# Patient Record
Sex: Female | Born: 1985 | Hispanic: Yes | Marital: Married | Smoking: Never smoker
Health system: Southern US, Community
[De-identification: ages and names within clinical notes are randomized; demographics above are authoritative.]

## PROBLEM LIST (undated history)

## (undated) ENCOUNTER — Inpatient Hospital Stay (HOSPITAL_COMMUNITY): Payer: Self-pay

## (undated) ENCOUNTER — Ambulatory Visit (HOSPITAL_COMMUNITY): Admission: EM | Payer: Self-pay

## (undated) DIAGNOSIS — Z862 Personal history of diseases of the blood and blood-forming organs and certain disorders involving the immune mechanism: Secondary | ICD-10-CM

---

## 2007-09-17 ENCOUNTER — Inpatient Hospital Stay (HOSPITAL_COMMUNITY)
Admission: AD | Admit: 2007-09-17 | Discharge: 2007-09-20 | Payer: Self-pay | Source: Ambulatory Visit | Admitting: Obstetrics

## 2010-12-27 ENCOUNTER — Emergency Department (HOSPITAL_COMMUNITY): Payer: Self-pay

## 2010-12-27 ENCOUNTER — Emergency Department (HOSPITAL_COMMUNITY)
Admission: EM | Admit: 2010-12-27 | Discharge: 2010-12-27 | Disposition: A | Discharge status: Home / Self Care | Payer: Worker's Compensation | Attending: Emergency Medicine | Admitting: Emergency Medicine

## 2010-12-27 DIAGNOSIS — R229 Localized swelling, mass and lump, unspecified: Secondary | ICD-10-CM | POA: Insufficient documentation

## 2010-12-27 DIAGNOSIS — S060X1A Concussion with loss of consciousness of 30 minutes or less, initial encounter: Secondary | ICD-10-CM | POA: Insufficient documentation

## 2010-12-27 DIAGNOSIS — W319XXA Contact with unspecified machinery, initial encounter: Secondary | ICD-10-CM | POA: Insufficient documentation

## 2010-12-27 DIAGNOSIS — W208XXA Other cause of strike by thrown, projected or falling object, initial encounter: Secondary | ICD-10-CM | POA: Insufficient documentation

## 2010-12-27 DIAGNOSIS — R51 Headache: Secondary | ICD-10-CM | POA: Insufficient documentation

## 2010-12-27 DIAGNOSIS — S0003XA Contusion of scalp, initial encounter: Secondary | ICD-10-CM | POA: Insufficient documentation

## 2010-12-27 DIAGNOSIS — Y9289 Other specified places as the place of occurrence of the external cause: Secondary | ICD-10-CM | POA: Insufficient documentation

## 2011-02-20 NOTE — H&P (Signed)
NAMEMAHLI, Beth Russell                ACCOUNT NO.:  [id number]   MEDICAL RECORD NO.:  [id number]          PATIENT TYPE:  INP   LOCATION:  9167                          FACILITY:  [facility]   PHYSICIAN:  Roseanna Rainbow, M.D.DATE OF BIRTH:  April 27, 1986   DATE OF ADMISSION:  09/17/2007  DATE OF DISCHARGE:                              HISTORY & PHYSICAL   CHIEF COMPLAINT:  The patient is a 25 year old para 0 with an estimated  date of confinement of September 15, 2007 with an intrauterine pregnancy  at 40+ weeks complaining of contractions and rupture of membranes.   HISTORY OF PRESENT ILLNESS:  Please see the above.  The patient reports  clear fluid.   SOCIAL HISTORY:  She is single.  She denies any tobacco, ethanol or drug  use.   ALLERGIES:  NO KNOWN DRUG ALLERGIES.   PAST GYN HISTORY:  Noncontributory.   PAST MEDICAL HISTORY:  She denies.   PAST SURGICAL HISTORY:  She denies.   FAMILY HISTORY:  Noncontributory.   ANTEPARTUM COURSE:  Prenatal care with Dr. Gaynell Face, onset of care at 24  weeks.   PRENATAL LABS:  Hemoglobin 11, hematocrit 33, platelets 261,000.  Blood  type O+, antibody screen negative, RPR nonreactive, rubella immune,  hepatitis B surface antigen negative, HIV nonreactive, PPD negative, GC  probe negative.  Chlamydia probe negative.  One-hour GTT 128, GBS  negative.   PHYSICAL EXAM:  Vital signs stable, afebrile.  Fetal heart tracing  reassuring.  Tocodynamometer:  Irregular uterine contractions.  Sterile  vaginal exam per the RN, the cervix is 4 cm dilated, 80% effaced.   ASSESSMENT:  Primigravida at term, late latent versus early active  labor, fetal heart tracing consistent with fetal well-being.   PLAN:  Admission, expectant management.      Roseanna Rainbow, M.D.  Electronically Signed    LAJ/MEDQ  D:  09/17/2007  T:  09/18/2007  Job:  [id number]

## 2011-05-21 ENCOUNTER — Other Ambulatory Visit: Payer: Self-pay | Admitting: Family Medicine

## 2011-05-21 DIAGNOSIS — Z3689 Encounter for other specified antenatal screening: Secondary | ICD-10-CM

## 2011-05-29 ENCOUNTER — Encounter (HOSPITAL_COMMUNITY): Payer: Self-pay

## 2011-05-29 ENCOUNTER — Ambulatory Visit (HOSPITAL_COMMUNITY)
Admission: RE | Admit: 2011-05-29 | Discharge: 2011-05-29 | Disposition: A | Discharge status: Home / Self Care | Payer: Medicaid Other | Source: Ambulatory Visit | Attending: Family Medicine | Admitting: Family Medicine

## 2011-05-29 DIAGNOSIS — O358XX0 Maternal care for other (suspected) fetal abnormality and damage, not applicable or unspecified: Secondary | ICD-10-CM | POA: Insufficient documentation

## 2011-05-29 DIAGNOSIS — Z1389 Encounter for screening for other disorder: Secondary | ICD-10-CM | POA: Insufficient documentation

## 2011-05-29 DIAGNOSIS — Z363 Encounter for antenatal screening for malformations: Secondary | ICD-10-CM | POA: Insufficient documentation

## 2011-05-29 DIAGNOSIS — Z3689 Encounter for other specified antenatal screening: Secondary | ICD-10-CM

## 2011-07-16 LAB — CBC
HCT: 34.7 — ABNORMAL LOW
Hemoglobin: 10.4 — ABNORMAL LOW
MCHC: 34.4
MCHC: 35.4
MCV: 97
Platelets: 194
Platelets: 204
RDW: 12.9
RDW: 13.1

## 2011-07-16 LAB — RPR: RPR Ser Ql: NONREACTIVE

## 2011-08-13 ENCOUNTER — Other Ambulatory Visit (HOSPITAL_COMMUNITY): Payer: Self-pay | Admitting: Physician Assistant

## 2011-08-21 ENCOUNTER — Ambulatory Visit (HOSPITAL_COMMUNITY)
Admission: RE | Admit: 2011-08-21 | Discharge: 2011-08-21 | Disposition: A | Discharge status: Home / Self Care | Payer: Medicaid Other | Source: Ambulatory Visit | Attending: Physician Assistant | Admitting: Physician Assistant

## 2011-08-21 DIAGNOSIS — O365990 Maternal care for other known or suspected poor fetal growth, unspecified trimester, not applicable or unspecified: Secondary | ICD-10-CM | POA: Insufficient documentation

## 2011-08-21 DIAGNOSIS — Z3689 Encounter for other specified antenatal screening: Secondary | ICD-10-CM | POA: Insufficient documentation

## 2011-08-24 ENCOUNTER — Other Ambulatory Visit (HOSPITAL_COMMUNITY): Payer: Self-pay | Admitting: Physician Assistant

## 2011-08-24 DIAGNOSIS — O4100X0 Oligohydramnios, unspecified trimester, not applicable or unspecified: Secondary | ICD-10-CM

## 2011-08-25 ENCOUNTER — Encounter (HOSPITAL_COMMUNITY): Payer: Self-pay | Admitting: *Deleted

## 2011-08-25 ENCOUNTER — Inpatient Hospital Stay (HOSPITAL_COMMUNITY)
Admission: AD | Admit: 2011-08-25 | Discharge: 2011-08-25 | Disposition: A | Discharge status: Home / Self Care | Payer: Self-pay | Source: Ambulatory Visit | Attending: Obstetrics & Gynecology | Admitting: Obstetrics & Gynecology

## 2011-08-25 DIAGNOSIS — Z862 Personal history of diseases of the blood and blood-forming organs and certain disorders involving the immune mechanism: Secondary | ICD-10-CM

## 2011-08-25 DIAGNOSIS — N760 Acute vaginitis: Secondary | ICD-10-CM | POA: Insufficient documentation

## 2011-08-25 DIAGNOSIS — Z603 Acculturation difficulty: Secondary | ICD-10-CM

## 2011-08-25 DIAGNOSIS — Z3490 Encounter for supervision of normal pregnancy, unspecified, unspecified trimester: Secondary | ICD-10-CM

## 2011-08-25 DIAGNOSIS — O2390 Unspecified genitourinary tract infection in pregnancy, unspecified trimester: Secondary | ICD-10-CM | POA: Insufficient documentation

## 2011-08-25 DIAGNOSIS — A499 Bacterial infection, unspecified: Secondary | ICD-10-CM | POA: Insufficient documentation

## 2011-08-25 DIAGNOSIS — B9689 Other specified bacterial agents as the cause of diseases classified elsewhere: Secondary | ICD-10-CM | POA: Insufficient documentation

## 2011-08-25 HISTORY — DX: Personal history of diseases of the blood and blood-forming organs and certain disorders involving the immune mechanism: Z86.2

## 2011-08-25 LAB — URINALYSIS, ROUTINE W REFLEX MICROSCOPIC
Hgb urine dipstick: NEGATIVE
Nitrite: NEGATIVE
Protein, ur: NEGATIVE mg/dL
Specific Gravity, Urine: 1.015 (ref 1.005–1.030)
Urobilinogen, UA: 0.2 mg/dL (ref 0.0–1.0)

## 2011-08-25 LAB — URINE MICROSCOPIC-ADD ON

## 2011-08-25 LAB — WET PREP, GENITAL
Trich, Wet Prep: NONE SEEN
Yeast Wet Prep HPF POC: NONE SEEN

## 2011-08-25 MED ORDER — METRONIDAZOLE 500 MG PO TABS: 500.0000 mg | ORAL_TABLET | Freq: Two times a day (BID) | ORAL | Status: AC

## 2011-08-25 NOTE — Progress Notes (Signed)
Pt reports having some leaking of clear fluid x 3 days. Enough to wet her underwear not enough to wear a pad. Denies pain and reports good fetal movement.

## 2011-08-25 NOTE — ED Provider Notes (Signed)
History   Chief Complaint: Vaginal Discharge  HPI Beth Russell 25 y.o. female  G2P1001 at [redacted]w[redacted]d  presenting with vaginal discharge.   The patient reports she has had discharge since Wednesday. She describes a fishy odor. Whitish color, liquidy.  Patient denies contractions/decreased fetal movement/blood from vagina/rush of fluid.     OB History    Grav Para Term Preterm Abortions TAB SAB Ect Mult Living   2 1 1  0 0 0 0 0 0 1    no complications in first pregnancy.   Past Medical History  Diagnosis Date  . No pertinent past medical history   . History of sickle cell trait 08/25/2011  . Language barrier-spanish speaking only  08/25/2011    Past Surgical History  Procedure Date  . No past surgeries     No family history on file.  History  Substance Use Topics  . Smoking status: Never Smoker   . Smokeless tobacco: Not on file  . Alcohol Use: No    Allergies: No Known Allergies  Prescriptions prior to admission  Medication Sig Dispense Refill  . loratadine (CLARITIN) 10 MG tablet Take 10 mg by mouth daily.        . prenatal vitamin w/FE, FA (PRENATAL 1 + 1) 27-1 MG TABS Take 1 tablet by mouth daily.          ROS negative except as noted in HPI   Physical Exam   Blood pressure 119/64, pulse 110, temperature 98.3 F (36.8 C), temperature source Oral, resp. rate 18, height 5\' 1"  (1.549 m), weight 64.774 kg (142 lb 12.8 oz), last menstrual period 12/27/2010.  Physical Exam  Constitutional: She is oriented to person, place, and time. She appears well-developed and well-nourished. No distress.  Cardiovascular: Normal rate and regular rhythm.  Exam reveals no gallop and no friction rub.   No murmur heard. Respiratory: Breath sounds normal. She has no wheezes. She has no rales. She exhibits no tenderness.  GI:       Gravid. Fundal height c/w dates.   Musculoskeletal: She exhibits no edema and no tenderness.       Prominent varicose veins noted on bilateral ankles.    Neurological: She is alert and oriented to person, place, and time.   Speculum exam-cystocele noted on vaginal exam. revealed thick whitish fluid/discharge. Normal closed cervix.  MAU Course  Procedures  MDM Obtain wet prep and gc/chlamydia due to discharge. History not consistent with rupture-will not obtain ferning.  Moderate leuks on wet prep. Fishy odor to discharge. Will treat presumptively as BV.   Assessment and Plan  #1 Beth Russell 25 y.o. female  G2P1001 at [redacted]w[redacted]d #2 Language Barrier-spanish speaking only #3 Bacterial vaginosis #4 history of sickle trait  Will treat with 7 days of metronidazole 500mg  Discharge home.  Given instructions for false labor, BV, and kick counts.   Case Discussed with Beth Russell, CNM  Beth Conch, MD PGY1 08/25/2011, 6:26 PM

## 2011-08-25 NOTE — Progress Notes (Signed)
Pt presents to MAU with complaints of vaginal discharge that started 3 days ago. Pt is [redacted]w[redacted]d

## 2011-08-27 LAB — GC/CHLAMYDIA PROBE AMP, GENITAL
Chlamydia, DNA Probe: NEGATIVE
GC Probe Amp, Genital: NEGATIVE

## 2011-08-28 ENCOUNTER — Ambulatory Visit (HOSPITAL_COMMUNITY)
Admission: RE | Admit: 2011-08-28 | Discharge: 2011-08-28 | Disposition: A | Discharge status: Home / Self Care | Payer: Self-pay | Source: Ambulatory Visit | Attending: Physician Assistant | Admitting: Physician Assistant

## 2011-08-28 DIAGNOSIS — Z3689 Encounter for other specified antenatal screening: Secondary | ICD-10-CM | POA: Insufficient documentation

## 2011-08-28 DIAGNOSIS — O4100X0 Oligohydramnios, unspecified trimester, not applicable or unspecified: Secondary | ICD-10-CM

## 2011-10-09 NOTE — L&D Delivery Note (Signed)
Delivery Note At 4:33 AM a viable female was delivered via Vaginal, Spontaneous Delivery (Presentation: Left Occiput Anterior).  APGAR: 9, 9; weight 6 lb 0.1 oz (2725 g).   Placenta status:spont, intact 3VC ,  with the following complications: .none. Unsure when SROM occurred. Not felt on my initial exam.  Anesthesia: Epidural  Episiotomy: none Lacerations: none  Est. Blood Loss (mL): 300  Mom to postpartum.  Baby to nursery-stable.  POE,DEIRDRE 10/10/2011, 4:42 AM

## 2011-10-10 ENCOUNTER — Encounter (HOSPITAL_COMMUNITY): Payer: Self-pay | Admitting: *Deleted

## 2011-10-10 ENCOUNTER — Inpatient Hospital Stay (HOSPITAL_COMMUNITY)
Admission: AD | Admit: 2011-10-10 | Discharge: 2011-10-12 | DRG: 775 | Disposition: A | Discharge status: Home / Self Care | Payer: Medicaid Other | Source: Ambulatory Visit | Attending: Obstetrics & Gynecology | Admitting: Obstetrics & Gynecology

## 2011-10-10 ENCOUNTER — Encounter (HOSPITAL_COMMUNITY): Payer: Self-pay | Admitting: Anesthesiology

## 2011-10-10 ENCOUNTER — Inpatient Hospital Stay (HOSPITAL_COMMUNITY): Payer: Medicaid Other | Admitting: Anesthesiology

## 2011-10-10 LAB — GC/CHLAMYDIA PROBE AMP, GENITAL: Gonorrhea: NEGATIVE

## 2011-10-10 LAB — CBC
Hemoglobin: 11.3 g/dL — ABNORMAL LOW (ref 12.0–15.0)
MCH: 32.5 pg (ref 26.0–34.0)
MCHC: 34.8 g/dL (ref 30.0–36.0)
RDW: 13.1 % (ref 11.5–15.5)

## 2011-10-10 LAB — HIV ANTIBODY (ROUTINE TESTING W REFLEX): HIV: NONREACTIVE

## 2011-10-10 LAB — RPR: RPR Ser Ql: NONREACTIVE

## 2011-10-10 MED ORDER — SODIUM BICARBONATE 8.4 % IV SOLN
INTRAVENOUS | Status: DC | PRN
  Administered 2011-10-10: 4 mL via EPIDURAL

## 2011-10-10 MED ORDER — WITCH HAZEL-GLYCERIN EX PADS: 1.0000 "application " | MEDICATED_PAD | CUTANEOUS | Status: DC | PRN

## 2011-10-10 MED ORDER — CITRIC ACID-SODIUM CITRATE 334-500 MG/5ML PO SOLN: 30.0000 mL | ORAL | Status: DC | PRN

## 2011-10-10 MED ORDER — ONDANSETRON HCL 4 MG/2ML IJ SOLN: 4.0000 mg | Freq: Four times a day (QID) | INTRAMUSCULAR | Status: DC | PRN

## 2011-10-10 MED ORDER — PRENATAL MULTIVITAMIN CH
1.0000 | ORAL_TABLET | Freq: Every day | ORAL | Status: DC
  Administered 2011-10-10 – 2011-10-12 (×3): 1 via ORAL
  Filled 2011-10-10 (×3): qty 1

## 2011-10-10 MED ORDER — DIBUCAINE 1 % RE OINT: 1.0000 "application " | TOPICAL_OINTMENT | RECTAL | Status: DC | PRN

## 2011-10-10 MED ORDER — ONDANSETRON HCL 4 MG/2ML IJ SOLN: 4.0000 mg | INTRAMUSCULAR | Status: DC | PRN

## 2011-10-10 MED ORDER — SALINE SPRAY 0.65 % NA SOLN
1.0000 | NASAL | Status: DC | PRN
  Administered 2011-10-11 – 2011-10-12 (×3): 1 via NASAL
  Filled 2011-10-10: qty 44

## 2011-10-10 MED ORDER — ACETAMINOPHEN 325 MG PO TABS: 650.0000 mg | ORAL_TABLET | ORAL | Status: DC | PRN

## 2011-10-10 MED ORDER — IBUPROFEN 600 MG PO TABS
600.0000 mg | ORAL_TABLET | Freq: Four times a day (QID) | ORAL | Status: DC
  Administered 2011-10-10 – 2011-10-12 (×9): 600 mg via ORAL
  Filled 2011-10-10: qty 11
  Filled 2011-10-10 (×8): qty 1

## 2011-10-10 MED ORDER — DIPHENHYDRAMINE HCL 25 MG PO CAPS: 25.0000 mg | ORAL_CAPSULE | Freq: Four times a day (QID) | ORAL | Status: DC | PRN

## 2011-10-10 MED ORDER — SIMETHICONE 80 MG PO CHEW: 80.0000 mg | CHEWABLE_TABLET | ORAL | Status: DC | PRN

## 2011-10-10 MED ORDER — IBUPROFEN 600 MG PO TABS: 600.0000 mg | ORAL_TABLET | Freq: Four times a day (QID) | ORAL | Status: DC | PRN

## 2011-10-10 MED ORDER — EPHEDRINE 5 MG/ML INJ
10.0000 mg | INTRAVENOUS | Status: DC | PRN
  Filled 2011-10-10 (×2): qty 4

## 2011-10-10 MED ORDER — OXYTOCIN 20 UNITS IN LACTATED RINGERS INFUSION - SIMPLE
125.0000 mL/h | Freq: Once | INTRAVENOUS | Status: AC
  Administered 2011-10-10: 125 mL/h via INTRAVENOUS

## 2011-10-10 MED ORDER — OXYTOCIN BOLUS FROM INFUSION
500.0000 mL | Freq: Once | INTRAVENOUS | Status: DC
  Filled 2011-10-10: qty 1000
  Filled 2011-10-10: qty 500

## 2011-10-10 MED ORDER — PHENYLEPHRINE 40 MCG/ML (10ML) SYRINGE FOR IV PUSH (FOR BLOOD PRESSURE SUPPORT)
80.0000 ug | PREFILLED_SYRINGE | INTRAVENOUS | Status: DC | PRN
  Filled 2011-10-10 (×2): qty 5

## 2011-10-10 MED ORDER — ZOLPIDEM TARTRATE 5 MG PO TABS: 5.0000 mg | ORAL_TABLET | Freq: Every evening | ORAL | Status: DC | PRN

## 2011-10-10 MED ORDER — EPHEDRINE 5 MG/ML INJ
10.0000 mg | INTRAVENOUS | Status: DC | PRN
  Filled 2011-10-10: qty 4

## 2011-10-10 MED ORDER — DIPHENHYDRAMINE HCL 50 MG/ML IJ SOLN: 12.5000 mg | INTRAMUSCULAR | Status: DC | PRN

## 2011-10-10 MED ORDER — LACTATED RINGERS IV SOLN: 500.0000 mL | INTRAVENOUS | Status: DC | PRN

## 2011-10-10 MED ORDER — BENZOCAINE-MENTHOL 20-0.5 % EX AERO: 1.0000 "application " | INHALATION_SPRAY | CUTANEOUS | Status: DC | PRN

## 2011-10-10 MED ORDER — LACTATED RINGERS IV SOLN
INTRAVENOUS | Status: DC
  Administered 2011-10-10: 03:00:00 via INTRAVENOUS

## 2011-10-10 MED ORDER — BENZOCAINE-MENTHOL 20-0.5 % EX AERO
INHALATION_SPRAY | CUTANEOUS | Status: AC
  Administered 2011-10-10: 07:00:00
  Filled 2011-10-10: qty 56

## 2011-10-10 MED ORDER — LACTATED RINGERS IV SOLN: 500.0000 mL | Freq: Once | INTRAVENOUS | Status: DC

## 2011-10-10 MED ORDER — PHENYLEPHRINE 40 MCG/ML (10ML) SYRINGE FOR IV PUSH (FOR BLOOD PRESSURE SUPPORT)
80.0000 ug | PREFILLED_SYRINGE | INTRAVENOUS | Status: DC | PRN
  Filled 2011-10-10: qty 5

## 2011-10-10 MED ORDER — ONDANSETRON HCL 4 MG PO TABS: 4.0000 mg | ORAL_TABLET | ORAL | Status: DC | PRN

## 2011-10-10 MED ORDER — TETANUS-DIPHTH-ACELL PERTUSSIS 5-2.5-18.5 LF-MCG/0.5 IM SUSP
0.5000 mL | Freq: Once | INTRAMUSCULAR | Status: AC
  Administered 2011-10-11: 0.5 mL via INTRAMUSCULAR

## 2011-10-10 MED ORDER — FENTANYL 2.5 MCG/ML BUPIVACAINE 1/10 % EPIDURAL INFUSION (WH - ANES)
14.0000 mL/h | INTRAMUSCULAR | Status: DC
  Filled 2011-10-10: qty 60

## 2011-10-10 MED ORDER — LIDOCAINE HCL (PF) 1 % IJ SOLN
30.0000 mL | INTRAMUSCULAR | Status: DC | PRN
  Filled 2011-10-10 (×2): qty 30

## 2011-10-10 MED ORDER — PSEUDOEPHEDRINE HCL 30 MG PO TABS
60.0000 mg | ORAL_TABLET | Freq: Four times a day (QID) | ORAL | Status: DC | PRN
  Administered 2011-10-11 – 2011-10-12 (×3): 60 mg via ORAL
  Filled 2011-10-10 (×2): qty 1
  Filled 2011-10-10 (×2): qty 2

## 2011-10-10 MED ORDER — FENTANYL 2.5 MCG/ML BUPIVACAINE 1/10 % EPIDURAL INFUSION (WH - ANES)
INTRAMUSCULAR | Status: DC | PRN
  Administered 2011-10-10: 12 mL/h via EPIDURAL

## 2011-10-10 MED ORDER — LANOLIN HYDROUS EX OINT: TOPICAL_OINTMENT | CUTANEOUS | Status: DC | PRN

## 2011-10-10 MED ORDER — OXYCODONE-ACETAMINOPHEN 5-325 MG PO TABS: 2.0000 | ORAL_TABLET | ORAL | Status: DC | PRN

## 2011-10-10 MED ORDER — SENNOSIDES-DOCUSATE SODIUM 8.6-50 MG PO TABS
2.0000 | ORAL_TABLET | Freq: Every day | ORAL | Status: DC
  Administered 2011-10-10 – 2011-10-11 (×2): 2 via ORAL

## 2011-10-10 MED ORDER — OXYCODONE-ACETAMINOPHEN 5-325 MG PO TABS
1.0000 | ORAL_TABLET | ORAL | Status: DC | PRN
  Administered 2011-10-10 – 2011-10-11 (×4): 1 via ORAL
  Filled 2011-10-10 (×4): qty 1

## 2011-10-10 MED ORDER — FLEET ENEMA 7-19 GM/118ML RE ENEM: 1.0000 | ENEMA | RECTAL | Status: DC | PRN

## 2011-10-10 NOTE — Progress Notes (Signed)
In safety rounds.  Will call back for full report.

## 2011-10-10 NOTE — Anesthesia Procedure Notes (Signed)

## 2011-10-10 NOTE — H&P (Signed)
Beth Russell is a 26 y.o. female presenting for term labor at [redacted]w[redacted]d by [place] at 19w Onset UCs 12 MN. Positive show . No leakage of fluid.  Maternal Medical History:  Reason for admission: Reason for admission: contractions.  Reason for Admission:   nauseaContractions: Onset was 3-5 hours ago.   Frequency: regular.    Fetal activity: Perceived fetal activity is normal.   Last perceived fetal movement was within the past hour.    Prenatal complications: no prenatal complications Prenatal Complications - Diabetes: none.    OB History    Grav Para Term Preterm Abortions TAB SAB Ect Mult Living   2 1 1  0 0 0 0 0 0 1     Past Medical History  Diagnosis Date  . No pertinent past medical history   . History of sickle cell trait 08/25/2011  . Language barrier-spanish speaking only  08/25/2011  2008 NSVD without complications, different paternity  Past Surgical History  Procedure Date  . No past surgeries    Family History: family history is not on file. Social History:  reports that she has never smoked. She does not have any smokeless tobacco history on file. She reports that she does not drink alcohol or use illicit drugs.  Review of Systems  Constitutional: Negative for fever.  Respiratory: Negative for cough.   Gastrointestinal: Positive for abdominal pain. Negative for nausea and vomiting.  Genitourinary: Negative for dysuria.  Skin: Negative for rash.  Neurological: Negative for headaches.    Dilation: 5.5 Effacement (%): 90 Station: -2 Exam by:: Felipa Furnace RN Blood pressure 126/75, pulse 105, temperature 98.6 F (37 C), temperature source Oral, resp. rate 20, height 5\' 1"  (1.549 m), weight 72.576 kg (160 lb), last menstrual period 12/27/2010. Maternal Exam:  Uterine Assessment: Contraction strength is firm.  Contraction frequency is regular.   Abdomen: Estimated fetal weight is 6 1/2#.   Fetal presentation: vertex  Introitus: Normal vulva. Vagina is negative for  discharge.  Ferning test: not done.   Pelvis: adequate for delivery.   Cervix: Cervix evaluated by digital exam.     Fetal Exam Fetal Monitor Review: Mode: ultrasound.   Baseline rate: 140-145.  Variability: moderate (6-25 bpm).   Pattern: accelerations present and no decelerations.   Prolonged decel 90's x4-5 min shortly after epidural  Fetal State Assessment: Category I - tracings are normal.     Physical Exam  Constitutional: She is oriented to person, place, and time. She appears well-developed and well-nourished. She appears distressed.  HENT:  Head: Normocephalic.  Neck: Neck supple.  Cardiovascular: Normal rate, regular rhythm and normal heart sounds.   Respiratory: Effort normal and breath sounds normal.  GI: Soft. There is no tenderness.  Genitourinary: Uterus normal. No vaginal discharge found.       VE per RN 5-6/80/-2  Musculoskeletal: Normal range of motion.  Neurological: She is alert and oriented to person, place, and time. She has normal reflexes.  Skin: Skin is warm and dry.  Psychiatric: She has a normal mood and affect.     Prenatal labs: ABO, Rh: O/Positive/-- (01/02 0303) Antibody: Negative (01/02 0303) Rubella: Immune (01/02 0303) RPR:   NR HBsAg: Negative (01/02 0303)  HIV: Non-reactive (01/02 0303)  GBS: Negative (01/02 0303)  1 hr glucola 95  Assessment/Plan: G2P1001 at [redacted]w[redacted]d in active labor Expectant management   Beth Russell 10/10/2011, 3:48 AM

## 2011-10-10 NOTE — Progress Notes (Addendum)
Beth Russell is a 26 y.o. G2P1001 at [redacted]w[redacted]d, admitted for labor  Subjective: Comfortable with epidural  Objective: BP 122/73  Pulse 92  Temp(Src) 98.6 F (37 C) (Oral)  Resp 20  Ht 5\' 1"  (1.549 m)  Wt 72.576 kg (160 lb)  BMI 30.23 kg/m2  LMP 12/27/2010  Fetal Heart Rate: 145 Variability: moderate Accelerations: present Decelerations: absent  Contractions: q 2-4 min x 50-60 sec  SVE:   Dilation: 7 Effacement (%): 90 Station: -1 Exam by:: Beth Russell, CNM  Cx 7-8, bloody show   Assessment / Plan: 26 y.o. G2P1001 at [redacted]w[redacted]d   Labor: active phase progressing Fetal Wellbeing: category 1 Pain Control:  epidural   Beth Russell 10/10/2011, 4:06 AM

## 2011-10-10 NOTE — Anesthesia Preprocedure Evaluation (Signed)

## 2011-10-10 NOTE — Anesthesia Postprocedure Evaluation (Signed)
Anesthesia Post Note  Patient: Beth Russell  Procedure(s) Performed: * No procedures listed *  Anesthesia type: Epidural  Patient location: Mother/Baby  Post pain: Pain level controlled  Post assessment: Post-op Vital signs reviewed  Last Vitals:  Filed Vitals:   10/10/11 0615  BP: 115/67  Pulse: 90  Temp: 36.5 C  Resp: 18    Post vital signs: Reviewed  Level of consciousness: awake  Complications: No apparent anesthesia complications

## 2011-10-10 NOTE — Progress Notes (Signed)
Pt G2 P1 at 37.3wks, having contractions with pinkish blood.

## 2011-10-10 NOTE — Progress Notes (Signed)
Pt delivered viable female with APGARS 9, 9 SVD. Poe CNM present.

## 2011-10-10 NOTE — Progress Notes (Signed)
Notified of VE and ctx pattern.  Notified of pt request for epidural. Will place admit orders.

## 2011-10-11 NOTE — Progress Notes (Signed)
Post Partum Day 1 Subjective: no complaints, up ad lib, voiding, tolerating PO, + flatus and breast feeding, bottle supplement  Objective: Blood pressure 111/69, pulse 116, temperature 98.3 F (36.8 C), temperature source Oral, resp. rate 20, height 5\' 1"  (1.549 m), weight 72.576 kg (160 lb), last menstrual period 12/27/2010, unknown if currently breastfeeding.  Physical Exam:  General: alert, cooperative and no distress Lochia: appropriate Uterine Fundus: firm Incision: n/a DVT Evaluation: No evidence of DVT seen on physical exam.   Basename 10/10/11 0244  HGB 11.3*  HCT 32.5*    Assessment/Plan:  Postpartum day 1 s.p SVD Plan for discharge tomorrow DESIRES MIRENA FOR CONTRACEPTION   LOS: 1 day   Forest Redwine V 10/11/2011, 3:51 PM

## 2011-10-12 MED ORDER — IBUPROFEN 600 MG PO TABS: 600.0000 mg | ORAL_TABLET | Freq: Four times a day (QID) | ORAL | Status: AC

## 2011-10-12 NOTE — Progress Notes (Signed)
Post Partum Day 2 Subjective: no complaints, up ad lib, voiding, tolerating PO and + flatus  Objective: Blood pressure 107/69, pulse 90, temperature 97.5 F (36.4 C), temperature source Oral, resp. rate 20, height 5\' 1"  (1.549 m), weight 72.576 kg (160 lb), last menstrual period 12/27/2010, unknown if currently breastfeeding.  Physical Exam:  General: alert, cooperative and no distress Lochia: appropriate Uterine Fundus: firm Incision: N/A DVT Evaluation: Homan's sign negative   Basename 10/10/11 0244  HGB 11.3*  HCT 32.5*    Assessment/Plan: Discharge home, Breastfeeding and Contraception IUD (mirena) Declines circumcision.   LOS: 2 days    D. Piloto [employer]. MD PGY-1  10/12/2011, 8:53 AM

## 2011-10-15 NOTE — Discharge Summary (Signed)
Obstetric Discharge Summary Reason for Admission: onset of labor Intrapartum Procedures: spontaneous vaginal delivery Postpartum Procedures: none Complications-Operative and Postpartum: none Hemoglobin  Date Value Range Status  10/10/2011 11.3* 12.0-15.0 (g/dL) Final     HCT  Date Value Range Status  10/10/2011 32.5* 36.0-46.0 (%) Final    Discharge Diagnoses: Term Pregnancy-delivered  Discharge Information: Date: 10/15/2011 Activity: unrestricted Diet: routine Medications: Ibuprofen Condition: stable Instructions: refer to practice specific booklet Discharge to: home Breastfeeding. Contraception: IUD( Mirena) No interested in circumcision for her baby boy.  Follow-up Information    Make an appointment in 6 weeks to follow up. Maricela Curet una cita en 6 semanas con su medico.)          Newborn Data: Live born female  Birth Weight: 6 lb 0.1 oz (2725 g) APGAR: 9, 9  Home with mother.  PILOTO, Yaslyn Cumby 10/15/2011, 3:09 PM

## 2011-10-16 NOTE — Discharge Summary (Signed)
Agree with note. 

## 2013-05-09 ENCOUNTER — Emergency Department (HOSPITAL_COMMUNITY)
Admission: EM | Admit: 2013-05-09 | Discharge: 2013-05-09 | Disposition: A | Discharge status: Home / Self Care | Payer: Self-pay | Attending: Emergency Medicine | Admitting: Emergency Medicine

## 2013-05-09 ENCOUNTER — Emergency Department (HOSPITAL_COMMUNITY): Payer: Self-pay

## 2013-05-09 ENCOUNTER — Encounter (HOSPITAL_COMMUNITY): Payer: Self-pay | Admitting: Physical Medicine and Rehabilitation

## 2013-05-09 DIAGNOSIS — H9209 Otalgia, unspecified ear: Secondary | ICD-10-CM | POA: Insufficient documentation

## 2013-05-09 DIAGNOSIS — Z862 Personal history of diseases of the blood and blood-forming organs and certain disorders involving the immune mechanism: Secondary | ICD-10-CM | POA: Insufficient documentation

## 2013-05-09 DIAGNOSIS — M542 Cervicalgia: Secondary | ICD-10-CM | POA: Insufficient documentation

## 2013-05-09 MED ORDER — LIDOCAINE VISCOUS 2 % MT SOLN
15.0000 mL | Freq: Once | OROMUCOSAL | Status: AC
  Administered 2013-05-09: 15 mL via OROMUCOSAL
  Filled 2013-05-09: qty 15

## 2013-05-09 NOTE — ED Provider Notes (Signed)
  Medical screening examination/treatment/procedure(s) were performed by non-physician practitioner and as supervising physician I was immediately available for consultation/collaboration.    Espn Zeman, MD 05/09/13 2355 

## 2013-05-09 NOTE — ED Provider Notes (Signed)
CSN: [id number]     Arrival date & time 05/09/13  1548 History     First MD Initiated Contact with Patient 05/09/13 1612     Chief Complaint  Patient presents with  . Otalgia  . Sore Throat   (Consider location/radiation/quality/duration/timing/severity/associated sxs/prior Treatment) HPI Comments: Patient states that for four years she has had a burning pain in the right side of her throat.  The pain is persistent and radiates into her right ear, and right upper back.  States when she swallows she feels there is something in the right side of her throat, described as phlegm.   States she had blood come from her right ear yesterday.  Also notes burning pain of her right nostril that when she inhales, the pain radiates over her right face into her right back.  She also occasionally gets bumps on the right side of her tongue.  The pain in her throat is exacerbated by cigarette smoke.  Denies fever, hearing problems, cough, postnasal drip, SOB, CP, abdominal pain.  No difficulty swallowing or breathing.  She is eating and drinking normally.   She has been to many doctors for this and is always prescribed antibiotics which do not help.  States she would like some radiographs to see what this is.    The history is provided by the patient.    Past Medical History  Diagnosis Date  . No pertinent past medical history   . History of sickle cell trait 08/25/2011  . Language barrier-spanish speaking only  08/25/2011   Past Surgical History  Procedure Laterality Date  . No past surgeries     No family history on file. History  Substance Use Topics  . Smoking status: Never Smoker   . Smokeless tobacco: Not on file  . Alcohol Use: No   OB History   Grav Para Term Preterm Abortions TAB SAB Ect Mult Living   2 2 2  0 0 0 0 0 0 2     Review of Systems  Constitutional: Negative for fever and chills.  HENT: Positive for ear pain and sore throat. Negative for hearing loss, congestion, trouble  swallowing, voice change, postnasal drip and sinus pressure.   Respiratory: Negative for shortness of breath, wheezing and stridor.   Cardiovascular: Negative for chest pain.  Gastrointestinal: Negative for abdominal pain.    Allergies  Review of patient's allergies indicates no known allergies.  Home Medications  No current outpatient prescriptions on file. BP 94/64  Pulse 75  Temp(Src) 97.9 F (36.6 C) (Oral)  Resp 18  SpO2 100% Physical Exam  Nursing note and vitals reviewed. Constitutional: She appears well-developed and well-nourished. No distress.  HENT:  Head: Normocephalic and atraumatic.  Right Ear: Tympanic membrane and ear canal normal.  Left Ear: Tympanic membrane and ear canal normal.  Nose: Nose normal. Right sinus exhibits no maxillary sinus tenderness and no frontal sinus tenderness. Left sinus exhibits no maxillary sinus tenderness and no frontal sinus tenderness.  Mouth/Throat: Uvula is midline and oropharynx is clear and moist. Mucous membranes are not pale and not dry. No edematous. No oropharyngeal exudate, posterior oropharyngeal edema or posterior oropharyngeal erythema.  Small amount of reddish brown cerumen in right ear canal.   Eyes: Conjunctivae are normal.  Neck: Trachea normal, normal range of motion and phonation normal. Neck supple. No tracheal tenderness, no spinous process tenderness and no muscular tenderness present. No rigidity. No tracheal deviation, no edema, no erythema and normal range of motion present. No  thyromegaly present.  No palpable masses of the neck  Cardiovascular: Normal rate and regular rhythm.   Pulmonary/Chest: Effort normal and breath sounds normal. No stridor. No respiratory distress. She has no wheezes. She has no rales.  Lymphadenopathy:    She has no cervical adenopathy.  Neurological: She is alert.  Skin: She is not diaphoretic.  Psychiatric: She has a normal mood and affect. Her behavior is normal.    ED Course    Procedures (including critical care time)  Labs Reviewed - No data to display Dg Neck Soft Tissue  05/09/2013   *RADIOLOGY REPORT*  Clinical Data: 27 year old female with throat pain and sore throat.  NECK SOFT TISSUES - 1+ VIEW  Comparison: None  Findings: The prevertebral soft tissues are within normal limits. The epiglottis and aryepiglottic folds are within normal limits. There is no evidence of tracheal narrowing. No radio-opaque foreign bodies are present. The visualized bony structures are unremarkable.  IMPRESSION: Unremarkable exam.   Original Report Authenticated By: Harmon Pier, M.D.   1. Neck pain on right side     MDM  Pt with 4 years of FB sensation vs burning pain in her right neck/right throat.  Radiation throughout right head and neck.  Pt is afebrile and nontoxic.  No palpable masses.  TMs intact and ear canals normal - red/brown cerumen in right ear may have been what patient saw as "blood."  Exam otherwise unremarkable. GI cocktail helped the pain in her throat but not the pain in the right neck and ear.  Pt is currently breastfeeding and I am unable to give her IV contrast for a CT scan.  My suspicion is also low for anything emergent but pt does not currently have a PCP or good follow up.  I therefore ordered an xray, soft tissue neck, which was negative.  Pt d/c home with ENT follow up.  PCP resources given. She declined pain medication.  Discussed all results,  findings, treatment, follow up  with patient.  Pt given return precautions.  Pt verbalizes understanding and agrees with plan.       Trixie Dredge, PA-C 05/09/13 2127

## 2013-05-09 NOTE — ED Notes (Signed)
Pt presents to department for evaluation of R sided ear pain and sore throat. Pt states sore throat x3 years. 5/10 pain at the time. Denies fever. Pt/family does not speak english, translator phone used. Pt is alert and oriented x4. NAD.

## 2013-10-08 NOTE — L&D Delivery Note (Signed)
Patient is 28 y.o. [id number] 6029w5d by early sono admitted in active labor, hx of precipitous delivery   Delivery Note At 2:05 PM a viable female was delivered via Vaginal, Spontaneous Delivery (Presentation: Right Occiput Anterior).  APGAR: 8, 9; weight  .   Placenta status: Intact, Spontaneous.  Cord: 3 vessels with the following complications: None.  Delivery of shoulders delayed 2/2 maternal effort, posterior shoulder delivered first; not true dystocia.  Anesthesia: None  Episiotomy: None Lacerations: 1st degree Suture Repair: 3.0 vicryl rapide Est. Blood Loss (mL): 200  Mom to postpartum.  Baby to Couplet care / Skin to Skin.  Marsena Taff ROCIO 10/06/2014, 2:34 PM

## 2013-12-08 ENCOUNTER — Ambulatory Visit: Payer: No Typology Code available for payment source | Attending: Internal Medicine

## 2014-02-03 ENCOUNTER — Encounter (HOSPITAL_COMMUNITY): Payer: Self-pay | Admitting: Emergency Medicine

## 2014-02-03 ENCOUNTER — Emergency Department (HOSPITAL_COMMUNITY)
Admission: EM | Admit: 2014-02-03 | Discharge: 2014-02-03 | Disposition: A | Discharge status: Home / Self Care | Payer: No Typology Code available for payment source | Source: Home / Self Care | Attending: Family Medicine | Admitting: Family Medicine

## 2014-02-03 DIAGNOSIS — K0889 Other specified disorders of teeth and supporting structures: Secondary | ICD-10-CM

## 2014-02-03 DIAGNOSIS — K089 Disorder of teeth and supporting structures, unspecified: Secondary | ICD-10-CM

## 2014-02-03 MED ORDER — AMOXICILLIN 500 MG PO CAPS: 500.0000 mg | ORAL_CAPSULE | Freq: Three times a day (TID) | ORAL | Status: DC

## 2014-02-03 MED ORDER — DICLOFENAC SODIUM 50 MG PO TBEC: 50.0000 mg | DELAYED_RELEASE_TABLET | Freq: Two times a day (BID) | ORAL | Status: DC | PRN

## 2014-02-03 NOTE — ED Notes (Signed)
Pt c/o dental pain onset 2-3 months  Pain radiates to right ear Taking antibiotics from [place]  Alert w/no signs of acute distress.

## 2014-02-03 NOTE — ED Provider Notes (Signed)
Beth Russell is a 28 y.o. female who presents to Urgent Care today for right tooth pain radiating to the right ear. Symptoms present for 2-3 months. Worsening recently. Patient has had 2 different antibiotics over the past 2 months. No fevers or chills nausea vomiting or diarrhea. Pain is moderate and worse with chewing.   Past Medical History  Diagnosis Date  . No pertinent past medical history   . History of sickle cell trait 08/25/2011  . Language barrier-spanish speaking only  08/25/2011   History  Substance Use Topics  . Smoking status: Never Smoker   . Smokeless tobacco: Not on file  . Alcohol Use: No   ROS as above Medications: No current facility-administered medications for this encounter.   Current Outpatient Prescriptions  Medication Sig Dispense Refill  . amoxicillin (AMOXIL) 500 MG capsule Take 1 capsule (500 mg total) by mouth 3 (three) times daily. spanish  30 capsule  0  . diclofenac (VOLTAREN) 50 MG EC tablet Take 1 tablet (50 mg total) by mouth 2 (two) times daily as needed. spanish  60 tablet  0    Exam:  BP 111/58  Pulse 81  Temp(Src) 98 F (36.7 C) (Oral)  Resp 18  SpO2 100% Gen: Well NAD HEENT: EOMI,  MMM no dental caries present. Tender palpation right lower premolar. No gum line erythema Lungs: Normal work of breathing. CTABL Heart: RRR no MRG Abd: NABS, Soft. NT, ND Exts: Brisk capillary refill, warm and well perfused.   No results found for this or any previous visit (from the past 24 hour(s)). No results found.  Assessment and Plan: 28 y.o. female with dental pain. Possible infected dental caries. Plan to treat with amoxicillin and diclofenac. Refer to dentist.  Discussed warning signs or symptoms. Please see discharge instructions. Patient expresses understanding.    Rodolph BongEvan S Corey, MD 02/03/14 [id number]

## 2014-02-03 NOTE — Discharge Instructions (Signed)
Gracias por venir hoy.  Ver a Conservator, museum/galleryun dentista pronto.  Por favor llame a la Dr. Lawrence Marseillesivils office [phone] or cell [phone] [address], [place]  El costo de la extraccin de dientes es de $ 200, e incluye examen, radiografa , y la extraccin y seguimiento de la visita . Traiga la lista de los medicamentos actuales con usted.  Por favor llame al [phone] Affordable dentaduras para obtener los detalles para conseguir su diente Corporate investment bankersacaron   Dolor dental (Dental Pain) Usted ha consultado con el profesional que lo asiste porque sufre dolor en un diente. ste puede estar ocasionado en caries dentales, las que exponen el nervio del diente al aire, y a temperaturas fras o calientes, lo que Sports administratorproduce dolor. Puede provenir de una infeccin o absceso (tambin denominado fornculo) alrededor del diente, que tambin suele ser causado por caries dentales. Esto produce el dolor que usted siente. DIAGNSTICO El profesional que lo asiste puede diagnosticar el problema con un examen bucal. TRATAMIENTO  Si la causa es una infeccin, puede tratarse con antibiticos (medicamentos que combaten grmenes) y Media plannermedicamentos que Corporate investment bankeralivien el dolor, como le ha recetado el profesional que lo asiste. Tome la medicacin como se le indic.  Utilice los medicamentos de venta libre o de prescripcin para Chief Technology Officerel dolor, Environmental health practitionerel malestar o la [place]fiebre, segn se lo indique el profesional que lo asiste.  Debido a que Nurse, adultel dolor dental generalmente es causado por infeccin o por una enfermedad dental, es aconsejable que acuda a su dentista lo antes posible para que le realice un tratamiento ms profundo. SOLICITE ATENCIN MDICA DE INMEDIATO SI: El examen y el tratamiento que recibi hoy fue indicado slo para hacer frente a la Teaching laboratory technicianurgencia. No constituye un sustituto de la atencin mdica o Musiciandental completa. Si su problema empeora o surgen nuevos sntomas (problemas) y no puede concertar una cita con su odontlogo para un pronto  seguimiento, llame o acuda nuevamente a Educational psychologisteste centro. SOLICITE ATENCIN MDICA DE INMEDIATO SI:  Tiene fiebre.  Presenta enrojecimiento e hinchazn en el rostro, la mandbula o el cuello.  No puede abrir Government social research officerla boca.  Siente un dolor intenso que no puede ser Intelcontrolado con los medicamentos. EST SEGURO QUE:   Comprende las instrucciones para el alta mdica.  Controlar su enfermedad.  Solicitar atencin mdica de inmediato segn las indicaciones. Document Released: 09/24/2005 Document Revised: 12/17/2011 [facility] Patient Information 2014 [place] ParkExitCare, MarylandLLC.

## 2014-02-22 ENCOUNTER — Inpatient Hospital Stay (HOSPITAL_COMMUNITY): Payer: No Typology Code available for payment source

## 2014-02-22 ENCOUNTER — Encounter (HOSPITAL_COMMUNITY): Payer: Self-pay

## 2014-02-22 ENCOUNTER — Inpatient Hospital Stay (HOSPITAL_COMMUNITY)
Admission: AD | Admit: 2014-02-22 | Discharge: 2014-02-22 | Disposition: A | Discharge status: Home / Self Care | Payer: No Typology Code available for payment source | Source: Ambulatory Visit | Attending: Obstetrics & Gynecology | Admitting: Obstetrics & Gynecology

## 2014-02-22 DIAGNOSIS — O26899 Other specified pregnancy related conditions, unspecified trimester: Secondary | ICD-10-CM

## 2014-02-22 DIAGNOSIS — O418X10 Other specified disorders of amniotic fluid and membranes, first trimester, not applicable or unspecified: Secondary | ICD-10-CM

## 2014-02-22 DIAGNOSIS — O208 Other hemorrhage in early pregnancy: Secondary | ICD-10-CM | POA: Insufficient documentation

## 2014-02-22 DIAGNOSIS — R109 Unspecified abdominal pain: Secondary | ICD-10-CM | POA: Insufficient documentation

## 2014-02-22 DIAGNOSIS — O468X1 Other antepartum hemorrhage, first trimester: Secondary | ICD-10-CM

## 2014-02-22 DIAGNOSIS — O9989 Other specified diseases and conditions complicating pregnancy, childbirth and the puerperium: Secondary | ICD-10-CM

## 2014-02-22 DIAGNOSIS — O99891 Other specified diseases and conditions complicating pregnancy: Secondary | ICD-10-CM | POA: Insufficient documentation

## 2014-02-22 LAB — ABO/RH: ABO/RH(D): O POS

## 2014-02-22 LAB — URINE MICROSCOPIC-ADD ON

## 2014-02-22 LAB — URINALYSIS, ROUTINE W REFLEX MICROSCOPIC
Bilirubin Urine: NEGATIVE
Glucose, UA: NEGATIVE mg/dL
KETONES UR: NEGATIVE mg/dL
NITRITE: NEGATIVE
PROTEIN: NEGATIVE mg/dL
Specific Gravity, Urine: 1.01 (ref 1.005–1.030)
Urobilinogen, UA: 0.2 mg/dL (ref 0.0–1.0)
pH: 6.5 (ref 5.0–8.0)

## 2014-02-22 LAB — CBC
HCT: 35.9 % — ABNORMAL LOW (ref 36.0–46.0)
HEMOGLOBIN: 12.7 g/dL (ref 12.0–15.0)
MCH: 31.4 pg (ref 26.0–34.0)
MCHC: 35.4 g/dL (ref 30.0–36.0)
MCV: 88.9 fL (ref 78.0–100.0)
Platelets: 250 10*3/uL (ref 150–400)
RBC: 4.04 MIL/uL (ref 3.87–5.11)
RDW: 12.2 % (ref 11.5–15.5)
WBC: 8.2 10*3/uL (ref 4.0–10.5)

## 2014-02-22 LAB — WET PREP, GENITAL
CLUE CELLS WET PREP: NONE SEEN
TRICH WET PREP: NONE SEEN
YEAST WET PREP: NONE SEEN

## 2014-02-22 LAB — HCG, QUANTITATIVE, PREGNANCY: hCG, Beta Chain, Quant, S: 34576 m[IU]/mL — ABNORMAL HIGH (ref <5)

## 2014-02-22 LAB — POCT PREGNANCY, URINE: Preg Test, Ur: POSITIVE — AB

## 2014-02-22 MED ORDER — FLUCONAZOLE 150 MG PO TABS: 150.0000 mg | ORAL_TABLET | Freq: Every day | ORAL | Status: DC

## 2014-02-22 NOTE — MAU Note (Signed)
Spanish speaking pt translating by International PaperEda Royal.  Pt is having some abd pain today.  Positive HPT x 2 weeks ago.  Denies vaginal bleeding.  Pt called to register after stepping outside to use phone.  No distress noted.

## 2014-02-22 NOTE — MAU Note (Signed)
Patient states she has had a positive home pregnancy test. Has abdominal pain all over the abdomen. States she has occasional nausea or vomiting. Denies bleeding or discharge.

## 2014-02-22 NOTE — MAU Note (Signed)
Pt states via Eda is here for intermittent upper abd pain that began 3 weeks ago. Has had issues with acid reflux. Rates pain 7. Denies bleeding or vag d/c.

## 2014-02-22 NOTE — MAU Provider Note (Signed)
History     CSN: [id number]  Arrival date and time: 02/22/14 1046   First Provider Initiated Contact with Patient 02/22/14 1339      Chief Complaint  Patient presents with  . Possible Pregnancy  . Abdominal Pain   HPI Comments: Beth Russell is a 28 y.o. [phone] and pregnant who presents with abdominal pain. HPI is facilitated by [place] interpreter. The patient's pain began 3 weeks ago, is diffuse, intermittent, and a 7/10 at its worst. She has not taken anything for the pain. She had a positive HPT 2 weeks ago. She is unsure of her last LMP because she had an IUD removed 4 months ago and has had irregular periods/spotting since. She states she has had the flu for about a month and half, which includes fevers, but she has been feeling better lately. She was recently prescribed amoxicillin for possible infected dental carries during a visit to the ED on 04/29, but she states she did not take the medication. She also reports having occasional nausea and vomiting. She denies dysuria, hematuria, constipation, diarrhea.  Possible Pregnancy Associated symptoms include abdominal pain, nausea and vomiting. Pertinent negatives include no chills or fever.  Abdominal Pain Associated symptoms include nausea and vomiting. Pertinent negatives include no constipation, diarrhea, dysuria, fever or hematuria.    OB History   Grav Para Term Preterm Abortions TAB SAB Ect Mult Living   3 2 2  0 0 0 0 0 0 2      Past Medical History  Diagnosis Date  . No pertinent past medical history   . History of sickle cell trait 08/25/2011  . Language barrier-spanish speaking only  08/25/2011    Past Surgical History  Procedure Laterality Date  . No past surgeries      History reviewed. No pertinent family history.  History  Substance Use Topics  . Smoking status: Never Smoker   . Smokeless tobacco: Never Used  . Alcohol Use: No    Allergies: No Known Allergies  No prescriptions prior to  admission   Results for orders placed during the hospital encounter of 02/22/14 (from the past 48 hour(s))  URINALYSIS, ROUTINE W REFLEX MICROSCOPIC     Status: Abnormal   Collection Time    02/22/14 11:15 AM      Result Value Ref Range   Color, Urine YELLOW  YELLOW   APPearance CLOUDY (*) CLEAR   Specific Gravity, Urine 1.010  1.005 - 1.030   pH 6.5  5.0 - 8.0   Glucose, UA NEGATIVE  NEGATIVE mg/dL   Hgb urine dipstick TRACE (*) NEGATIVE   Bilirubin Urine NEGATIVE  NEGATIVE   Ketones, ur NEGATIVE  NEGATIVE mg/dL   Protein, ur NEGATIVE  NEGATIVE mg/dL   Urobilinogen, UA 0.2  0.0 - 1.0 mg/dL   Nitrite NEGATIVE  NEGATIVE   Leukocytes, UA LARGE (*) NEGATIVE  URINE MICROSCOPIC-ADD ON     Status: Abnormal   Collection Time    02/22/14 11:15 AM      Result Value Ref Range   Squamous Epithelial / LPF MANY (*) RARE   WBC, UA 0-2  <3 WBC/hpf   Bacteria, UA FEW (*) RARE   Urine-Other YEAST    POCT PREGNANCY, URINE     Status: Abnormal   Collection Time    02/22/14 11:20 AM      Result Value Ref Range   Preg Test, Ur POSITIVE (*) NEGATIVE   Comment:  THE SENSITIVITY OF THIS     METHODOLOGY IS >24 mIU/mL  WET PREP, GENITAL     Status: Abnormal   Collection Time    02/22/14  2:06 PM      Result Value Ref Range   Yeast Wet Prep HPF POC NONE SEEN  NONE SEEN   Trich, Wet Prep NONE SEEN  NONE SEEN   Clue Cells Wet Prep HPF POC NONE SEEN  NONE SEEN   WBC, Wet Prep HPF POC MANY (*) NONE SEEN   Comment: MODERATE BACTERIA SEEN  CBC     Status: Abnormal   Collection Time    02/22/14  2:10 PM      Result Value Ref Range   WBC 8.2  4.0 - 10.5 K/uL   RBC 4.04  3.87 - 5.11 MIL/uL   Hemoglobin 12.7  12.0 - 15.0 g/dL   HCT 82.935.9 (*) 56.236.0 - 13.046.0 %   MCV 88.9  78.0 - 100.0 fL   MCH 31.4  26.0 - 34.0 pg   MCHC 35.4  30.0 - 36.0 g/dL   RDW 86.512.2  78.411.5 - 69.615.5 %   Platelets 250  150 - 400 K/uL  ABO/RH     Status: None   Collection Time    02/22/14  2:10 PM      Result Value Ref  Range   ABO/RH(D) O POS    HCG, QUANTITATIVE, PREGNANCY     Status: Abnormal   Collection Time    02/22/14  2:10 PM      Result Value Ref Range   hCG, Beta Chain, Quant, S 34576 (*) <5 mIU/mL   Comment:              GEST. AGE      CONC.  (mIU/mL)       <=1 WEEK        5 - 50         2 WEEKS       50 - 500         3 WEEKS       100 - 10,000         4 WEEKS     1,000 - 30,000         5 WEEKS     3,500 - 115,000       6-8 WEEKS     12,000 - 270,000        12 WEEKS     15,000 - 220,000                FEMALE AND NON-PREGNANT FEMALE:         LESS THAN 5 mIU/mL    [place] Ob Comp Less 14 Wks  02/22/2014   CLINICAL DATA:  First trimester pregnancy. Pelvic pain for 3 weeks. LMP unknown.  EXAM: OBSTETRIC <14 WK US AND TRANSVAGINAL OB US  TECHNIQUE: Both transabdominal and transvaginal ultrasound examinations were performed for complete evaluation of the gestation as well as the maternal uterus, adnexal regions, and pelvic cul-de-sac. Transvaginal technique was performed to assess early pregnancy.  COMPARISON:  None.  FINDINGS: Intrauterine gestational sac: Visualized/normal in shape.  Yolk sac:  Visualized.  Embryo:  Visualized.  Cardiac Activity: Visualized.  Heart Rate:  142 bpm  CRL:   11.9  mm   7 w 3 d                  US EDC: 10/08/2014  Maternal uterus/adnexae: Adjacent to the intrauterine  gestational sac is an additional ill-defined fluid collection which is favored to reflect a subacute subchorionic hematoma. Conceivably, this could reflect a second gestational sac with a blighted ovum. No other embryo or yolk sac identified. Both maternal ovaries are visualized and appear normal. There is no free pelvic fluid.  IMPRESSION: 1. Single live intrauterine gestation with best estimated gestational age of [redacted] weeks 3 days. 2. Adjacent to the intrauterine gestational sac is a fluid collection which is favored to reflect a subacute subchorionic hematoma over a second gestational sac without demonstrated  viability. Attention on follow-up suggested. 3. No evidence of adnexal mass or free pelvic fluid.   Electronically Signed   By: Roxy Horseman M.D.   On: 02/22/2014 15:26   US Ob Transvaginal  02/22/2014   CLINICAL DATA:  First trimester pregnancy. Pelvic pain for 3 weeks. LMP unknown.  EXAM: OBSTETRIC <14 WK [place] AND TRANSVAGINAL OB US  TECHNIQUE: Both transabdominal and transvaginal ultrasound examinations were performed for complete evaluation of the gestation as well as the maternal uterus, adnexal regions, and pelvic cul-de-sac. Transvaginal technique was performed to assess early pregnancy.  COMPARISON:  None.  FINDINGS: Intrauterine gestational sac: Visualized/normal in shape.  Yolk sac:  Visualized.  Embryo:  Visualized.  Cardiac Activity: Visualized.  Heart Rate:  142 bpm  CRL:   11.9  mm   7 w 3 d                  [place] EDC: 10/08/2014  Maternal uterus/adnexae: Adjacent to the intrauterine gestational sac is an additional ill-defined fluid collection which is favored to reflect a subacute subchorionic hematoma. Conceivably, this could reflect a second gestational sac with a blighted ovum. No other embryo or yolk sac identified. Both maternal ovaries are visualized and appear normal. There is no free pelvic fluid.  IMPRESSION: 1. Single live intrauterine gestation with best estimated gestational age of [redacted] weeks 3 days. 2. Adjacent to the intrauterine gestational sac is a fluid collection which is favored to reflect a subacute subchorionic hematoma over a second gestational sac without demonstrated viability. Attention on follow-up suggested. 3. No evidence of adnexal mass or free pelvic fluid.   Electronically Signed   By: Roxy Horseman M.D.   On: 02/22/2014 15:26    Review of Systems  Constitutional: Negative for fever and chills.  Gastrointestinal: Positive for nausea, vomiting and abdominal pain. Negative for diarrhea and constipation.  Genitourinary: Negative for dysuria and hematuria.   Physical Exam    Blood pressure 100/53, pulse 86, temperature 98.2 F (36.8 C), temperature source Oral, resp. rate 16, height 5\' 1"  (1.549 m), weight 63.594 kg (140 lb 3.2 oz), SpO2 100.00%.  Physical Exam  Constitutional: She appears well-developed.  HENT:  Head: Normocephalic.  Cardiovascular: Normal rate.   Respiratory: Effort normal.  GI: Soft. There is no tenderness.  Genitourinary: Cervix exhibits no motion tenderness, no discharge and no friability. Right adnexum displays no mass and no tenderness. Left adnexum displays tenderness ("burning"). Left adnexum displays no mass. No bleeding around the vagina. Vaginal discharge (thick milky white) found.  Neurological: She is alert.  Skin: Skin is warm and dry.  Psychiatric: She has a normal mood and affect.    MAU Course  Procedures None  MDM Urine pregnancy to confirm pregnancy Urinalysis, urine microscopic to r/o infection Speculum exam with GC/chlamydia and wet prep to r/o infection CBC, Abo Rh, Hcg, U/S per pain in early pregnancy protocol + yeast in urine; diflucan ordered.  Assessment and Plan   A:  1. Abdominal pain in pregnancy   2. Subchorionic hematoma in first trimester   3.  IUP with cardiac activity 1858w3d  P:  Discharge home in stable condition Start prenatal care ASAP; HD information given Return to MAU if symptoms worsen Prenatal vitamins daily  Maryfrances BunnellStephanie Agoncillo 02/22/2014, 2:09 PM   Evaluation and management procedures were performed by the PA student under my supervision and collaboration. I have reviewed the note and chart, and I agree with the management and plan.  Iona HansenJennifer Irene Louie Meaders, NP 02/23/2014. 8:34 AM

## 2014-02-22 NOTE — Discharge Instructions (Signed)
Dolor abdominal en el embarazo  (Abdominal Pain During Pregnancy)  El dolor abdominal es frecuente durante el embarazo. Generalmente no causa ningún daño. El dolor abdominal puede tener numerosas causas. Algunas causas son más graves que otras. Ciertas causas de dolor abdominal durante el embarazo se diagnostican fácilmente. A veces, se tarda un tiempo para llegar al diagnóstico. Otras veces la causa no se conoce. El dolor abdominal puede estar relacionado con alguna alteración del embarazo, o puede deberse a una causa totalmente diferente. Por este motivo, siempre consulte a su médico cuando sienta molestias abdominales.  INSTRUCCIONES PARA EL CUIDADO EN EL HOGAR   Esté atenta al dolor para ver si hay cambios. Las siguientes indicaciones ayudarán a aliviar cualquier molestia que pueda sentir:  · No tenga relaciones sexuales y no coloque nada dentro de la vagina hasta que los síntomas hayan desaparecido completamente.  · Descanse todo lo que pueda hasta que el dolor se le haya calmado.  · Si siente náuseas, beba líquidos claros. Evite los alimentos sólidos mientras sienta malestar o tenga náuseas.  · Tome sólo medicamentos de venta libre o recetados, según las indicaciones del médico.  · Cumpla con todas las visitas de control, según le indique su médico.  SOLICITE ATENCIÓN MÉDICA DE INMEDIATO SI:  · Tiene un sangrado, pérdida de líquidos o elimina tejidos por la vagina.  · El dolor o los cólicos aumentan.  · Tiene vómitos persistentes.  · Comienza a sentir dolor al orinar u observa sangre.  · Tiene fiebre.  · Nota que los movimientos del bebé disminuyen.  · Siente intensa debilidad o se marea.  · Tiene dificultad para respirar con o sin dolor abdominal.  · Siente un dolor de cabeza intenso junto al dolor abdominal.  · Tiene una secreción vaginal anormal con dolor abdominal.  · Tiene diarrea persistente.  · El dolor abdominal sigue o empeora aún después de hacer reposo.  ASEGÚRESE DE QUE:   · Comprende estas  instrucciones.  · Controlará su afección.  · Recibirá ayuda de inmediato si no mejora o si empeora.  Document Released: 09/24/2005 Document Revised: 07/15/2013  ExitCare® Patient Information ©2014 ExitCare, LLC.

## 2014-02-23 ENCOUNTER — Telehealth: Payer: Self-pay | Admitting: Obstetrics and Gynecology

## 2014-02-23 ENCOUNTER — Telehealth (HOSPITAL_COMMUNITY): Payer: Self-pay | Admitting: Obstetrics and Gynecology

## 2014-02-23 LAB — GC/CHLAMYDIA PROBE AMP
CT PROBE, AMP APTIMA: NEGATIVE
GC PROBE AMP APTIMA: NEGATIVE

## 2014-02-23 MED ORDER — FLUCONAZOLE 150 MG PO TABS
150.0000 mg | ORAL_TABLET | Freq: Every day | ORAL | Status: DC
Start: 2014-02-23 — End: 2014-03-30

## 2014-02-23 NOTE — Telephone Encounter (Signed)
Spanish interpretor used. Diflucan ordered prior to discharge 5/18; pt never received the medication. I called the patient and informed her that I sent two pills to her pharmacy for a yeast infection.

## 2014-02-26 NOTE — MAU Provider Note (Signed)
Attestation of Attending Supervision of Fellow: Evaluation and management procedures were performed by the Fellow under my supervision and collaboration.  I have reviewed the Fellow's note and chart, and I agree with the management and plan.    

## 2014-03-30 ENCOUNTER — Ambulatory Visit (INDEPENDENT_AMBULATORY_CARE_PROVIDER_SITE_OTHER): Payer: No Typology Code available for payment source | Admitting: Advanced Practice Midwife

## 2014-03-30 ENCOUNTER — Encounter: Payer: Self-pay | Admitting: Advanced Practice Midwife

## 2014-03-30 VITALS — BP 107/69 | HR 90 | Temp 96.8°F | Ht 60.0 in | Wt 139.2 lb

## 2014-03-30 DIAGNOSIS — Z3490 Encounter for supervision of normal pregnancy, unspecified, unspecified trimester: Secondary | ICD-10-CM

## 2014-03-30 DIAGNOSIS — Z3480 Encounter for supervision of other normal pregnancy, unspecified trimester: Secondary | ICD-10-CM

## 2014-03-30 LAB — POCT URINALYSIS DIP (DEVICE)
BILIRUBIN URINE: NEGATIVE
Glucose, UA: NEGATIVE mg/dL
Hgb urine dipstick: NEGATIVE
KETONES UR: NEGATIVE mg/dL
NITRITE: NEGATIVE
PH: 5.5 (ref 5.0–8.0)
Protein, ur: NEGATIVE mg/dL
Specific Gravity, Urine: <=1.005 (ref 1.005–1.030)
Urobilinogen, UA: 0.2 mg/dL (ref 0.0–1.0)

## 2014-03-30 NOTE — Progress Notes (Signed)
Patient reports spotting 8 days ago

## 2014-03-30 NOTE — Progress Notes (Signed)
   Subjective:    Beth Russell is a [id number] 5641w4d being seen today for her first obstetrical visit.  Her obstetrical history is significant for NSVD x2. Patient does intend to breast feed. Pregnancy history fully reviewed.  Patient reports nausea.  Filed Vitals:   03/30/14 1503 03/30/14 1506  BP: 107/69   Pulse: 90   Temp: 96.8 F (36 C)   Height:  5' (1.524 m)  Weight: 139 lb 3.2 oz (63.141 kg)     HISTORY: OB History  Gravida Para Term Preterm AB SAB TAB Ectopic Multiple Living  3 2 2  0 0 0 0 0 0 2    # Outcome Date GA Lbr Len/2nd Weight Sex Delivery Anes PTL Lv  3 CUR           2 TRM 10/10/11 8925w3d 04:21 / 00:12 6 lb 0.1 oz (2.725 kg) M SVD EPI  Y  1 TRM 09/18/07 531w3d  7 lb 6 oz (3.345 kg) F SVD EPI  Y     Past Medical History  Diagnosis Date  . No pertinent past medical history   . History of sickle cell trait 08/25/2011  . Language barrier-spanish speaking only  08/25/2011   Past Surgical History  Procedure Laterality Date  . No past surgeries     History reviewed. No pertinent family history.   Exam    Uterus:     Pelvic Exam:    Perineum: No Hemorrhoids, Normal Perineum   Vulva: normal   Vagina:  normal mucosa, normal discharge   pH:    Cervix: multiparous appearance, no cervical motion tenderness and no lesions   Adnexa: normal adnexa and no mass, fullness, tenderness   Bony Pelvis: average  System: Breast:  normal appearance, no masses or tenderness   Skin: normal coloration and turgor, no rashes    Neurologic: oriented, normal, gait normal; reflexes normal and symmetric   Extremities: normal strength, tone, and muscle mass, ROM of all joints is normal   HEENT neck supple with midline trachea and thyroid without masses   Mouth/Teeth mucous membranes moist, pharynx normal without lesions and dental hygiene good   Neck supple and no masses   Cardiovascular: regular rate and rhythm   Respiratory:  appears well, vitals normal, no respiratory  distress, acyanotic, normal RR, ear and throat exam is normal, neck free of mass or lymphadenopathy, chest clear, no wheezing, crepitations, rhonchi, normal symmetric air entry   Abdomen: soft, non-tender; bowel sounds normal; no masses,  no organomegaly   Urinary: urethral meatus normal      Assessment:    Pregnancy: [id number] Patient Active Problem List   Diagnosis Date Noted  . Supervision of normal pregnancy 08/25/2011  . History of sickle cell trait 08/25/2011  . Language barrier-spanish speaking only  08/25/2011        Plan:     Initial labs drawn. Prenatal vitamins. Discussed Unisom/B6 for mild nausea Problem list reviewed and updated. Genetic Screening discussed Quad Screen: requested.  Ultrasound discussed; fetal survey: requested.  Follow up in 4 weeks. 50% of 30 min visit spent on counseling and coordination of care.     LEFTWICH-KIRBY, LISA 03/30/2014

## 2014-03-30 NOTE — Patient Instructions (Signed)
Medicamentos Nusea para tomar durante el embarazo:  Unisom (succinato de doxilamina 25 mg comprimidos) Tome una tableta al da al acostarse. Si los sntomas no estn adecuadamente controlados, la dosis puede aumentarse hasta una dosis mxima recomendada de Woodssidedos comprimidos al da (medio comprimido por la maana, media tableta a media tarde y otro antes de [place]acostarse).  Tabletas de 100 mg de vitamina B6. Tome una Halliburton Companytableta dos veces al da (hasta 200 mg por da).  Nuseas matinales (Morning Sickness) Se denominan nuseas matinales a las ganas de vomitar (nuseas) durante el Psychiatristembarazo. Esta sensacin puede estar acompaada o no de vmitos. Aparecen por la maana, pero puede ser un problema a lo largo de Union Pacific Corporationtodo el da. Las Ecolabnuseas matinales son ms frecuentes Physicist, medicaldurante el primer trimestre, Biomedical engineerpero pueden continuar durante todo el [place]embarazo. Aunque son molestas, generalmente no causan ningn dao, excepto que presente vmitos continuos e intensos (hiperemesis gravdica). Este problema requiere un tratamiento ms intenso.  CAUSAS  La causa de las nuseas matinales no se conoce completamente pero estas parecen estar relacionadas con los cambios hormonales normales que ocurren durante el [place]embarazo. FACTORES DE RIESGO Usted tendr mayor riesgo si:  Tena nuseas o vmitos antes de quedar embarazada.  Tuvo nuseas matinales durante los embarazos previos.  Est embarazada de ms de un beb, por ejemplo mellizos. TRATAMIENTO  No utilice ningn medicamento (recetado, de venta libre ni herbario) para este problema sin consultar con su mdico. El mdico tambin podr Camera operatorrecetar o recomendar:  Suplementos de vitamina B6.  Medicamentos para las nauseas  La medicina herbal llamada jengibre. INSTRUCCIONES PARA EL CUIDADO EN EL HOGAR   Tome solo medicamentos de venta libre o recetados, segn las indicaciones del mdico.  Tomar un multivitamnico antes de Scientist, research (physical sciences)quedar embarazada puede prevenir o disminuir la gravedad de las  nuseas matinales en la mayora de las mujeres.  Coma un trozo de [place] Verdetostada seca o galletas sin sal antes de levantarse de la cama por la maana.  Coma 5 o 6 comidas pequeas por da.  Consuma alimentos blandos y secos (arroz, papas asadas). Los alimentos ricos en carbohidratos generalmente ayudan.  No beba lquidos con las comidas. Tome lquidos Altria Groupentre las comidas.  Evite los alimentos muy grasos y condimentados.  Pdale a otra persona que cocine para usted si Quest Diagnosticsel olor de algn alimento le provoca nuseas o vmitos.  Si tiene ganas de vomitar despus de tomar las vitaminas prenatales, tmelas a la noche o con una colacin.  Tome colaciones de alimentos proteicos (frutos secos, yogur, queso) entre comidas si siente apetito.  Coma gelatina sin azcar de postre.  Una pulsera de acupresin (que se utiliza para Research scientist (life sciences)mareos en viajes) puede ser de [place]utilidad.  La acupuntura puede ayudarla.  No fume.  Consiga un humidificador para Customer service managermantener el aire de su casa libre de [place]olores.  Trate de respirar aire fresco. SOLICITE ATENCIN MDICA SI:   Los remedios caseros no funcionan y Media plannernecesita medicamentos.  Se siente mareada o sufre un desmayo.  Pierde peso. SOLICITE ATENCIN MDICA DE INMEDIATO SI:   Tiene nuseas y vmitos de manera persistente y no puede controlarlos.  Pierde el conocimiento (se desmaya). ASEGRESE DE QUE:  Comprende estas instrucciones.  Controlar su afeccin.  Recibir ayuda de inmediato si no mejora o si empeora. Document Released: 01/10/2009 Document Revised: 09/29/2013 [facility] LLCExitCare Patient Information 2015 [place]ExitCare, MarylandLLC. This information is not intended to replace advice given to you by your health care Burnie Therien. Make sure you discuss any questions you have with your health care Asahd Can.

## 2014-03-31 LAB — HIV ANTIBODY (ROUTINE TESTING W REFLEX): HIV 1&2 Ab, 4th Generation: NONREACTIVE

## 2014-03-31 LAB — HEPATITIS B SURFACE ANTIGEN: HEP B S AG: NEGATIVE

## 2014-04-01 LAB — CYTOLOGY - PAP

## 2014-04-05 LAB — RUBELLA ANTIBODY, IGM: Rubella IgM: 0.38

## 2014-05-05 ENCOUNTER — Encounter: Payer: Self-pay | Admitting: Advanced Practice Midwife

## 2014-05-05 ENCOUNTER — Ambulatory Visit (INDEPENDENT_AMBULATORY_CARE_PROVIDER_SITE_OTHER): Payer: No Typology Code available for payment source | Admitting: Advanced Practice Midwife

## 2014-05-05 VITALS — BP 101/56 | HR 82 | Wt 142.4 lb

## 2014-05-05 DIAGNOSIS — Z3480 Encounter for supervision of other normal pregnancy, unspecified trimester: Secondary | ICD-10-CM

## 2014-05-05 DIAGNOSIS — N898 Other specified noninflammatory disorders of vagina: Secondary | ICD-10-CM

## 2014-05-05 DIAGNOSIS — D573 Sickle-cell trait: Secondary | ICD-10-CM

## 2014-05-05 LAB — POCT URINALYSIS DIP (DEVICE)
Bilirubin Urine: NEGATIVE
GLUCOSE, UA: NEGATIVE mg/dL
Hgb urine dipstick: NEGATIVE
KETONES UR: NEGATIVE mg/dL
Nitrite: NEGATIVE
Protein, ur: NEGATIVE mg/dL
UROBILINOGEN UA: 0.2 mg/dL (ref 0.0–1.0)
pH: 6 (ref 5.0–8.0)

## 2014-05-05 MED ORDER — FLUCONAZOLE 150 MG PO TABS: 150.0000 mg | ORAL_TABLET | Freq: Once | ORAL | Status: DC

## 2014-05-05 NOTE — Progress Notes (Signed)
Thinks that she has a yeast infection.

## 2014-05-05 NOTE — Patient Instructions (Signed)
Second Trimester of Pregnancy The second trimester is from week 13 through week 28, months 4 through 6. The second trimester is often a time when you feel your best. Your body has also adjusted to being pregnant, and you begin to feel better physically. Usually, morning sickness has lessened or quit completely, you may have more energy, and you may have an increase in appetite. The second trimester is also a time when the fetus is growing rapidly. At the end of the sixth month, the fetus is about 9 inches long and weighs about 1 pounds. You will likely begin to feel the baby move (quickening) between 18 and 20 weeks of the pregnancy. BODY CHANGES Your body goes through many changes during pregnancy. The changes vary from woman to woman.   Your weight will continue to increase. You will notice your lower abdomen bulging out.  You may begin to get stretch marks on your hips, abdomen, and breasts.  You may develop headaches that can be relieved by medicines approved by your health care provider.  You may urinate more often because the fetus is pressing on your bladder.  You may develop or continue to have heartburn as a result of your pregnancy.  You may develop constipation because certain hormones are causing the muscles that push waste through your intestines to slow down.  You may develop hemorrhoids or swollen, bulging veins (varicose veins).  You may have back pain because of the weight gain and pregnancy hormones relaxing your joints between the bones in your pelvis and as a result of a shift in weight and the muscles that support your balance.  Your breasts will continue to grow and be tender.  Your gums may bleed and may be sensitive to brushing and flossing.  Dark spots or blotches (chloasma, mask of pregnancy) may develop on your face. This will likely fade after the baby is born.  A dark line from your belly button to the pubic area (linea nigra) may appear. This will likely fade  after the baby is born.  You may have changes in your hair. These can include thickening of your hair, rapid growth, and changes in texture. Some women also have hair loss during or after pregnancy, or hair that feels dry or thin. Your hair will most likely return to normal after your baby is born. WHAT TO EXPECT AT YOUR PRENATAL VISITS During a routine prenatal visit:  You will be weighed to make sure you and the fetus are growing normally.  Your blood pressure will be taken.  Your abdomen will be measured to track your baby's growth.  The fetal heartbeat will be listened to.  Any test results from the previous visit will be discussed. Your health care provider may ask you:  How you are feeling.  If you are feeling the baby move.  If you have had any abnormal symptoms, such as leaking fluid, bleeding, severe headaches, or abdominal cramping.  If you have any questions. Other tests that may be performed during your second trimester include:  Blood tests that check for:  Low iron levels (anemia).  Gestational diabetes (between 24 and 28 weeks).  Rh antibodies.  Urine tests to check for infections, diabetes, or protein in the urine.  An ultrasound to confirm the proper growth and development of the baby.  An amniocentesis to check for possible genetic problems.  Fetal screens for spina bifida and Down syndrome. HOME CARE INSTRUCTIONS   Avoid all smoking, herbs, alcohol, and unprescribed   drugs. These chemicals affect the formation and growth of the baby.  Follow your health care provider's instructions regarding medicine use. There are medicines that are either safe or unsafe to take during pregnancy.  Exercise only as directed by your health care provider. Experiencing uterine cramps is a good sign to stop exercising.  Continue to eat regular, healthy meals.  Wear a good support bra for breast tenderness.  Do not use hot tubs, steam rooms, or saunas.  Wear your  seat belt at all times when driving.  Avoid raw meat, uncooked cheese, cat litter boxes, and soil used by cats. These carry germs that can cause birth defects in the baby.  Take your prenatal vitamins.  Try taking a stool softener (if your health care provider approves) if you develop constipation. Eat more high-fiber foods, such as fresh vegetables or fruit and whole grains. Drink plenty of fluids to keep your urine clear or pale yellow.  Take warm sitz baths to soothe any pain or discomfort caused by hemorrhoids. Use hemorrhoid cream if your health care provider approves.  If you develop varicose veins, wear support hose. Elevate your feet for 15 minutes, 3-4 times a day. Limit salt in your diet.  Avoid heavy lifting, wear low heel shoes, and practice good posture.  Rest with your legs elevated if you have leg cramps or low back pain.  Visit your dentist if you have not gone yet during your pregnancy. Use a soft toothbrush to brush your teeth and be gentle when you floss.  A sexual relationship may be continued unless your health care provider directs you otherwise.  Continue to go to all your prenatal visits as directed by your health care provider. SEEK MEDICAL CARE IF:   You have dizziness.  You have mild pelvic cramps, pelvic pressure, or nagging pain in the abdominal area.  You have persistent nausea, vomiting, or diarrhea.  You have a bad smelling vaginal discharge.  You have pain with urination. SEEK IMMEDIATE MEDICAL CARE IF:   You have a fever.  You are leaking fluid from your vagina.  You have spotting or bleeding from your vagina.  You have severe abdominal cramping or pain.  You have rapid weight gain or loss.  You have shortness of breath with chest pain.  You notice sudden or extreme swelling of your face, hands, ankles, feet, or legs.  You have not felt your baby move in over an hour.  You have severe headaches that do not go away with  medicine.  You have vision changes. Document Released: 09/18/2001 Document Revised: 09/29/2013 Document Reviewed: 11/25/2012 [employer] Patient Information 2015 [employer]. This information is not intended to replace advice given to you by your health care provider. Make sure you discuss any questions you have with your health care provider.  

## 2014-05-05 NOTE — Addendum Note (Signed)
Addended by: Candelaria StagersHAIZLIP, Areliz Rothman E on: 05/05/2014 12:36 PM   Modules accepted: Orders

## 2014-05-05 NOTE — Progress Notes (Signed)
C/O itching.  Wet prep done. Discharge looks like yeast. Rx Diflucan with refill. Will sched. Anatomy US.

## 2014-05-06 LAB — WET PREP, GENITAL
Clue Cells Wet Prep HPF POC: NONE SEEN
Trich, Wet Prep: NONE SEEN
WBC, Wet Prep HPF POC: NONE SEEN

## 2014-05-17 ENCOUNTER — Ambulatory Visit (HOSPITAL_COMMUNITY)
Admission: RE | Admit: 2014-05-17 | Discharge: 2014-05-17 | Disposition: A | Discharge status: Home / Self Care | Payer: No Typology Code available for payment source | Source: Ambulatory Visit | Attending: Advanced Practice Midwife | Admitting: Advanced Practice Midwife

## 2014-05-17 ENCOUNTER — Other Ambulatory Visit: Payer: Self-pay | Admitting: Advanced Practice Midwife

## 2014-05-17 DIAGNOSIS — D573 Sickle-cell trait: Secondary | ICD-10-CM

## 2014-05-17 DIAGNOSIS — Z3689 Encounter for other specified antenatal screening: Secondary | ICD-10-CM | POA: Insufficient documentation

## 2014-05-17 DIAGNOSIS — O99019 Anemia complicating pregnancy, unspecified trimester: Secondary | ICD-10-CM | POA: Insufficient documentation

## 2014-05-23 ENCOUNTER — Encounter: Payer: Self-pay | Admitting: Advanced Practice Midwife

## 2014-06-02 ENCOUNTER — Ambulatory Visit (INDEPENDENT_AMBULATORY_CARE_PROVIDER_SITE_OTHER): Payer: No Typology Code available for payment source | Admitting: Advanced Practice Midwife

## 2014-06-02 VITALS — BP 102/57 | HR 83 | Temp 98.0°F | Wt 142.9 lb

## 2014-06-02 DIAGNOSIS — Z3492 Encounter for supervision of normal pregnancy, unspecified, second trimester: Secondary | ICD-10-CM

## 2014-06-02 DIAGNOSIS — Z3480 Encounter for supervision of other normal pregnancy, unspecified trimester: Secondary | ICD-10-CM

## 2014-06-02 LAB — POCT URINALYSIS DIP (DEVICE)
Bilirubin Urine: NEGATIVE
GLUCOSE, UA: NEGATIVE mg/dL
Ketones, ur: NEGATIVE mg/dL
Nitrite: NEGATIVE
Protein, ur: NEGATIVE mg/dL
SPECIFIC GRAVITY, URINE: 1.015 (ref 1.005–1.030)
Urobilinogen, UA: 0.2 mg/dL (ref 0.0–1.0)
pH: 6 (ref 5.0–8.0)

## 2014-06-02 NOTE — Progress Notes (Signed)
Feeling well. Concerned about varicose veins. Has compression hose. Reassurance given. Anatomy scan normal female.

## 2014-06-02 NOTE — Progress Notes (Signed)
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 I advised that she wear a mask if she has to be in this environment again.

## 2014-06-02 NOTE — Patient Instructions (Signed)
Segundo trimestre de embarazo (Second Trimester of Pregnancy) El segundo trimestre va desde la semana13 hasta la 28, desde el cuarto hasta el sexto mes, y suele ser el momento en el que mejor se siente. Su organismo se ha adaptado a estar embarazada y comienza a sentirse fsicamente mejor. En general, las nuseas matutinas han disminuido o han desaparecido completamente, p El segundo trimestre es tambin la poca en la que el feto se desarrolla rpidamente. Hacia el final del sexto mes, el feto mide aproximadamente 9pulgadas (23cm) y pesa alrededor de 1 libras (700g). Es probable que sienta que el beb se mueve (da pataditas) entre las 18 y 20semanas del embarazo. CAMBIOS EN EL ORGANISMO Su organismo atraviesa por muchos cambios durante el embarazo, y estos varan de una mujer a otra.   Seguir aumentando de peso. Notar que la parte baja del abdomen sobresale.  Podrn aparecer las primeras estras en las caderas, el abdomen y las mamas.  Es posible que tenga dolores de cabeza que pueden aliviarse con los medicamentos que su mdico autorice.  Tal vez tenga necesidad de orinar con ms frecuencia porque el feto est ejerciendo presin sobre la vejiga.  Debido al embarazo podr sentir acidez estomacal con frecuencia.  Puede estar estreida, ya que ciertas hormonas enlentecen los movimientos de los msculos que empujan los desechos a travs de los intestinos.  Pueden aparecer hemorroides o abultarse e hincharse las venas (venas varicosas).  Puede tener dolor de espalda que se debe al aumento de peso y a que las hormonas del embarazo relajan las articulaciones entre los huesos de la pelvis, y como consecuencia de la modificacin del peso y los msculos que mantienen el equilibrio.  Las mamas seguirn creciendo y le dolern.  Las encas pueden sangrar y estar sensibles al cepillado y al hilo dental.  Pueden aparecer zonas oscuras o manchas (cloasma, mscara del embarazo) en el rostro que  probablemente se atenuarn despus del nacimiento del beb.  Es posible que se forme una lnea oscura desde el ombligo hasta la zona del pubis (linea nigra) que probablemente se atenuarn despus del nacimiento del beb.  Tal vez haya cambios en el cabello que pueden incluir su engrosamiento, crecimiento rpido y cambios en la textura. Adems, a algunas mujeres se les cae el cabello durante o despus del embarazo, o tienen el cabello seco o fino. Lo ms probable es que el cabello se le normalice despus del nacimiento del beb. QU DEBE ESPERAR EN LAS CONSULTAS PRENATALES Durante una visita prenatal de rutina:  La pesarn para asegurarse de que usted y el feto estn creciendo normalmente.  Le tomarn la presin arterial.  Le medirn el abdomen para controlar el desarrollo del beb.  Se escucharn los latidos cardacos fetales.  Se evaluarn los resultados de los estudios solicitados en visitas anteriores. El mdico puede preguntarle lo siguiente:  Cmo se siente.  Si siente los movimientos del beb.  Si ha tenido sntomas anormales, como prdida de lquido, sangrado, dolores de cabeza intensos o clicos abdominales.  Si tiene alguna pregunta. Otros estudios que podrn realizarse durante el segundo trimestre incluyen lo siguiente:  Anlisis de sangre para detectar:  Concentraciones de hierro bajas (anemia).  Diabetes gestacional (entre la semana 24 y la 28).  Anticuerpos Rh.  Anlisis de orina para detectar infecciones, diabetes o protenas en la orina.  Una ecografa para confirmar que el beb crece y se desarrolla correctamente.  Una amniocentesis para diagnosticar posibles problemas genticos.  Estudios del feto para descartar espina   bfida y sndrome de Down. INSTRUCCIONES PARA EL CUIDADO EN EL HOGAR   Evite fumar, consumir hierbas, beber alcohol y tomar frmacos que no le hayan recetado. Estas sustancias qumicas afectan la formacin y el desarrollo del beb.  Siga  las indicaciones del mdico en relacin con el uso de medicamentos. Durante el embarazo, hay medicamentos que son seguros de tomar y otros que no.  Haga actividad fsica solo en la forma indicada por el mdico. Sentir clicos uterinos es un buen signo para detener la actividad fsica.  Contine comiendo alimentos que sanos con regularidad.  Use un sostn que le brinde buen soporte si le duelen las mamas.  No se d baos de inmersin en agua caliente, baos turcos ni saunas.  Colquese el cinturn de seguridad cuando conduzca.  No coma carne cruda ni queso sin cocinar; evite el contacto con las bandejas sanitarias de los gatos y la tierra que estos animales usan. Estos elementos contienen grmenes que pueden causar defectos congnitos en el beb.  Tome las vitaminas prenatales.  Si est estreida, pruebe un laxante suave (si el mdico lo autoriza). Consuma ms alimentos ricos en fibra, como vegetales y frutas frescos y cereales integrales. Beba gran cantidad de lquido para mantener la orina de tono claro o color amarillo plido.  Dese baos de asiento con agua tibia para aliviar el dolor o las molestias causadas por las hemorroides. Use una crema para las hemorroides si el mdico la autoriza.  Si tiene venas varicosas, use medias de descanso. Eleve los pies durante 15minutos, 3 o 4veces por da. Limite la cantidad de sal en su dieta.  No levante objetos pesados, use zapatos de tacones bajos y mantenga una buena postura.  Descanse con las piernas elevadas si tiene calambres o dolor de cintura.  Visite a su dentista si an no lo ha hecho durante el embarazo. Use un cepillo de dientes blando para higienizarse los dientes y psese el hilo dental con suavidad.  Puede seguir manteniendo relaciones sexuales, a menos que el mdico le indique lo contrario.  Concurra a todas las visitas prenatales segn las indicaciones de su mdico. SOLICITE ATENCIN MDICA SI:   Tiene mareos.  Siente  clicos leves, presin en la pelvis o dolor persistente en el abdomen.  Tiene nuseas, vmitos o diarrea persistentes.  Tiene secrecin vaginal con mal olor.  Siente dolor al orinar. SOLICITE ATENCIN MDICA DE INMEDIATO SI:   Tiene fiebre.  Tiene una prdida de lquido por la vagina.  Tiene sangrado o pequeas prdidas vaginales.  Siente dolor intenso o clicos en el abdomen.  Sube o baja de peso rpidamente.  Tiene dificultad para respirar y siente dolor de pecho.  Sbitamente se le hinchan mucho el rostro, las manos, los tobillos, los pies o las piernas.  No ha sentido los movimientos del beb durante una hora.  Siente un dolor de cabeza intenso que no se alivia con medicamentos.  Hay cambios en la visin. Document Released: 07/04/2005 Document Revised: 09/29/2013 [employer] Patient Information 2015 [employer]. This information is not intended to replace advice given to you by your health care provider. Make sure you discuss any questions you have with your health care provider.  

## 2014-06-03 LAB — ABO AND RH: Rh Type: POSITIVE

## 2014-06-03 LAB — AFP, QUAD SCREEN
AFP: 81.9 ng/mL
Age Alone: 1:914 {titer}
CURR GEST AGE: 21.5 wks.days
Down Syndrome Scr Risk Est: 1:17700 {titer}
HCG, Total: 14.8 IU/mL
INH: 238.1 pg/mL
Interpretation-AFP: NEGATIVE
MOM FOR AFP: 1.08
MOM FOR HCG: 0.84
MoM for INH: 1.38
Open Spina bifida: NEGATIVE
Osb Risk: 1:10100 {titer}
TRI 18 SCR RISK EST: NEGATIVE
uE3 Mom: 1.14
uE3 Value: 3.46 ng/mL

## 2014-06-03 LAB — RPR

## 2014-06-04 LAB — HEMOGLOBINOPATHY EVALUATION
HGB A: 57.3 % — AB (ref 96.8–97.8)
Hemoglobin Other: 0 %
Hgb A2 Quant: 3.1 % (ref 2.2–3.2)
Hgb F Quant: 0 % (ref 0.0–2.0)
Hgb S Quant: 39.6 % — ABNORMAL HIGH

## 2014-06-30 ENCOUNTER — Ambulatory Visit (INDEPENDENT_AMBULATORY_CARE_PROVIDER_SITE_OTHER): Payer: Medicaid Other | Admitting: Family

## 2014-06-30 VITALS — BP 110/61 | HR 91 | Temp 98.4°F | Wt 148.6 lb

## 2014-06-30 DIAGNOSIS — Z23 Encounter for immunization: Secondary | ICD-10-CM

## 2014-06-30 DIAGNOSIS — Z3492 Encounter for supervision of normal pregnancy, unspecified, second trimester: Secondary | ICD-10-CM

## 2014-06-30 DIAGNOSIS — Z3480 Encounter for supervision of other normal pregnancy, unspecified trimester: Secondary | ICD-10-CM

## 2014-06-30 LAB — POCT URINALYSIS DIP (DEVICE)
BILIRUBIN URINE: NEGATIVE
GLUCOSE, UA: NEGATIVE mg/dL
HGB URINE DIPSTICK: NEGATIVE
Ketones, ur: NEGATIVE mg/dL
NITRITE: NEGATIVE
Protein, ur: NEGATIVE mg/dL
Specific Gravity, Urine: 1.02 (ref 1.005–1.030)
Urobilinogen, UA: 0.2 mg/dL (ref 0.0–1.0)
pH: 7 (ref 5.0–8.0)

## 2014-06-30 NOTE — Progress Notes (Signed)
Intermittent pelvic pressure, 2-3x/day.  No vaginal bleeding or leaking of fluid.  Reviewed signs of preterm labor.  Vaginal itching resolved.  Mod leuks in urine, no UTI symptoms.  Urine culture sent.  Flu vaccine today.

## 2014-06-30 NOTE — Progress Notes (Signed)
C/o of intermittent pelvic pressure.  Flu vaccine today.

## 2014-07-01 LAB — CULTURE, OB URINE
Colony Count: NO GROWTH
ORGANISM ID, BACTERIA: NO GROWTH

## 2014-07-20 ENCOUNTER — Ambulatory Visit: Payer: Self-pay

## 2014-07-28 ENCOUNTER — Ambulatory Visit (INDEPENDENT_AMBULATORY_CARE_PROVIDER_SITE_OTHER): Payer: Self-pay | Admitting: Advanced Practice Midwife

## 2014-07-28 ENCOUNTER — Encounter: Payer: Self-pay | Admitting: Advanced Practice Midwife

## 2014-07-28 VITALS — BP 111/69 | HR 92 | Temp 98.0°F | Wt 154.3 lb

## 2014-07-28 DIAGNOSIS — N390 Urinary tract infection, site not specified: Secondary | ICD-10-CM

## 2014-07-28 DIAGNOSIS — R82998 Other abnormal findings in urine: Secondary | ICD-10-CM

## 2014-07-28 DIAGNOSIS — Z3492 Encounter for supervision of normal pregnancy, unspecified, second trimester: Secondary | ICD-10-CM

## 2014-07-28 DIAGNOSIS — Z23 Encounter for immunization: Secondary | ICD-10-CM

## 2014-07-28 DIAGNOSIS — K5900 Constipation, unspecified: Secondary | ICD-10-CM

## 2014-07-28 DIAGNOSIS — O99613 Diseases of the digestive system complicating pregnancy, third trimester: Secondary | ICD-10-CM

## 2014-07-28 DIAGNOSIS — Z3493 Encounter for supervision of normal pregnancy, unspecified, third trimester: Secondary | ICD-10-CM

## 2014-07-28 DIAGNOSIS — O99619 Diseases of the digestive system complicating pregnancy, unspecified trimester: Secondary | ICD-10-CM

## 2014-07-28 LAB — CBC
HCT: 33.5 % — ABNORMAL LOW (ref 36.0–46.0)
Hemoglobin: 11.5 g/dL — ABNORMAL LOW (ref 12.0–15.0)
MCH: 31.8 pg (ref 26.0–34.0)
MCHC: 34.3 g/dL (ref 30.0–36.0)
MCV: 92.5 fL (ref 78.0–100.0)
PLATELETS: 215 10*3/uL (ref 150–400)
RBC: 3.62 MIL/uL — AB (ref 3.87–5.11)
RDW: 13.5 % (ref 11.5–15.5)
WBC: 6.5 10*3/uL (ref 4.0–10.5)

## 2014-07-28 LAB — POCT URINALYSIS DIP (DEVICE)
Bilirubin Urine: NEGATIVE
GLUCOSE, UA: NEGATIVE mg/dL
Ketones, ur: NEGATIVE mg/dL
NITRITE: NEGATIVE
Protein, ur: 30 mg/dL — AB
Specific Gravity, Urine: 1.02 (ref 1.005–1.030)
UROBILINOGEN UA: 0.2 mg/dL (ref 0.0–1.0)
pH: 6 (ref 5.0–8.0)

## 2014-07-28 MED ORDER — TETANUS-DIPHTH-ACELL PERTUSSIS 5-2.5-18.5 LF-MCG/0.5 IM SUSP: 0.5000 mL | Freq: Once | INTRAMUSCULAR | Status: DC

## 2014-07-28 MED ORDER — POLYETHYLENE GLYCOL 3350 17 G PO PACK: 17.0000 g | PACK | Freq: Every day | ORAL | Status: DC

## 2014-07-28 MED ORDER — CALCIUM POLYCARBOPHIL 625 MG PO TABS: 625.0000 mg | ORAL_TABLET | Freq: Every day | ORAL | Status: DC

## 2014-07-28 MED ORDER — ONDANSETRON 4 MG PO TBDP: 4.0000 mg | ORAL_TABLET | Freq: Four times a day (QID) | ORAL | Status: DC | PRN

## 2014-07-28 NOTE — Progress Notes (Signed)
Having constipation x 4 days which causes nausea. Rx Zofran and Miralax. Advised to start Fibercon afterward. Language line used

## 2014-07-28 NOTE — Progress Notes (Signed)
28 wks labs, Tdap

## 2014-07-28 NOTE — Patient Instructions (Signed)
Estreimiento (Constipation) Estreimiento significa que una persona tiene menos de tres evacuaciones en una semana, dificultad para defecar, o que las heces son secas, duras, o ms grandes que lo normal. A medida que envejecemos el estreimiento es ms comn. Si intenta curar el estreimiento con medicamentos que producen la evacuacin de las heces (laxantes), el problema puede empeorar. El uso prolongado de laxantes puede hacer que los msculos del colon se debiliten. Una dieta baja en fibra, no tomar suficientes lquidos y el uso de ciertos medicamentos pueden empeorar el estreimiento.  CAUSAS   Ciertos medicamentos, como los antidepresivos, analgsicos, suplementos de hierro, anticidos y diurticos.  Algunas enfermedades, como la diabetes, el sndrome del colon irritable, enfermedad de la tiroides, o depresin.  No beber suficiente agua.  No consumir suficientes alimentos ricos en fibra.  Situaciones de estrs o viajes.  Falta de actividad fsica o de ejercicio.  Ignorar la necesidad sbita de defecar.  Uso en exceso de laxantes. SIGNOS Y SNTOMAS   Defecar menos de tres veces por semana.  Dificultad para defecar.  Tener las heces secas y duras, o ms grandes que las normales.  Sensacin de estar lleno o hinchado.  Dolor en la parte baja del abdomen.  No sentir alivio despus de defecar. DIAGNSTICO  El mdico le har una historia clnica y un examen fsico. Pueden hacerle exmenes adicionales para el estreimiento grave. Estos estudios pueden ser:  Un radiografa con enema de bario para examinar el recto, el colon y, en algunos casos, el intestino delgado.  Una sigmoidoscopia para examinar el colon inferior.  Una colonoscopia para examinar todo el colon. TRATAMIENTO  El tratamiento depender de la gravedad del estreimiento y de la causa. Algunos tratamientos nutricionales son beber ms lquidos y comer ms alimentos ricos en fibra. El cambio en el estilo de vida  incluye hacer ejercicios de manera regular. Si estas recomendaciones para realizar cambios en la dieta y en el estilo de vida no ayudan, el mdico le puede indicar el uso de laxantes de venta libre para ayudarlo a defecar. Los medicamentos recetados se pueden prescribir si los medicamentos de venta libre no lo ayudan.  INSTRUCCIONES PARA EL CUIDADO EN EL HOGAR   Consuma alimentos con alto contenido de fibra, como frutas, vegetales, cereales integrales y porotos.  Limite los alimentos procesados ricos en grasas y azcar, como las papas fritas, hamburguesas, galletas, dulces y refrescos.  Puede agregar un suplemento de fibra a su dieta si no obtiene lo suficiente de los alimentos.  Beba suficiente lquido para mantener la orina clara o de color amarillo plido.  Haga ejercicio regularmente o segn las indicaciones del mdico.  Vaya al bao cuando sienta la necesidad de ir. No se aguante las ganas.  Tome solo medicamentos de venta libre o recetados, segn las indicaciones del mdico. No tome otros medicamentos para el estreimiento sin consultarlo antes con su mdico. SOLICITE ATENCIN MDICA DE INMEDIATO SI:   Observa sangre brillante en las heces.  El estreimiento dura ms de 4 das o empeora.  Siente dolor abdominal o rectal.  Las heces son delgadas como un lpiz.  Pierde peso de manera inexplicable. ASEGRESE DE QUE:   Comprende estas instrucciones.  Controlar su afeccin.  Recibir ayuda de inmediato si no mejora o si empeora. Document Released: 10/14/2007 Document Revised: 09/29/2013 [employer] Patient Information 2015 [employer]. This information is not intended to replace advice given to you by your health care provider. Make sure you discuss any questions you have with your health   care provider.  

## 2014-07-29 LAB — HIV ANTIBODY (ROUTINE TESTING W REFLEX): HIV 1&2 Ab, 4th Generation: NONREACTIVE

## 2014-07-29 LAB — RPR

## 2014-07-29 LAB — GLUCOSE TOLERANCE, 1 HOUR (50G) W/O FASTING: Glucose, 1 Hour GTT: 130 mg/dL (ref 70–140)

## 2014-07-30 ENCOUNTER — Encounter (HOSPITAL_COMMUNITY): Payer: Self-pay | Admitting: Advanced Practice Midwife

## 2014-07-31 ENCOUNTER — Other Ambulatory Visit (HOSPITAL_COMMUNITY): Payer: Self-pay | Admitting: Advanced Practice Midwife

## 2014-07-31 DIAGNOSIS — O2343 Unspecified infection of urinary tract in pregnancy, third trimester: Secondary | ICD-10-CM

## 2014-07-31 MED ORDER — CEPHALEXIN 500 MG PO CAPS: 500.0000 mg | ORAL_CAPSULE | Freq: Four times a day (QID) | ORAL | Status: AC

## 2014-08-01 LAB — CULTURE, OB URINE: Colony Count: >=100000

## 2014-08-02 ENCOUNTER — Telehealth: Payer: Self-pay

## 2014-08-02 NOTE — Telephone Encounter (Signed)
[facility] Left message stating we are calling to inform you a RX has been sent to your pharmacy for a urinary tract infection, if you have any questions or concerns please call clinic.

## 2014-08-02 NOTE — Telephone Encounter (Signed)
Message copied by Louanna RawAMPBELL, Tavone Caesar M on Mon Aug 02, 2014 10:51 AM ------      Message from: Aviva SignsWILLIAMS, MARIE L      Created: Sat Jul 31, 2014  4:36 PM      Regarding: UTI tx       Has Staph UTI            I put in Rx for Keflex            Can u call her?            thanks ------

## 2014-08-09 ENCOUNTER — Encounter: Payer: Self-pay | Admitting: Advanced Practice Midwife

## 2014-08-11 ENCOUNTER — Ambulatory Visit (INDEPENDENT_AMBULATORY_CARE_PROVIDER_SITE_OTHER): Payer: Self-pay | Admitting: Advanced Practice Midwife

## 2014-08-11 VITALS — BP 100/64 | HR 125 | Temp 98.5°F | Wt 155.1 lb

## 2014-08-11 DIAGNOSIS — Z3493 Encounter for supervision of normal pregnancy, unspecified, third trimester: Secondary | ICD-10-CM

## 2014-08-11 DIAGNOSIS — B3731 Acute candidiasis of vulva and vagina: Secondary | ICD-10-CM

## 2014-08-11 DIAGNOSIS — B373 Candidiasis of vulva and vagina: Secondary | ICD-10-CM

## 2014-08-11 LAB — POCT URINALYSIS DIP (DEVICE)
BILIRUBIN URINE: NEGATIVE
Glucose, UA: NEGATIVE mg/dL
KETONES UR: NEGATIVE mg/dL
Nitrite: NEGATIVE
PH: 7 (ref 5.0–8.0)
PROTEIN: NEGATIVE mg/dL
Specific Gravity, Urine: 1.015 (ref 1.005–1.030)
Urobilinogen, UA: 0.2 mg/dL (ref 0.0–1.0)

## 2014-08-11 MED ORDER — TERCONAZOLE 0.4 % VA CREA: 1.0000 | TOPICAL_CREAM | Freq: Every day | VAGINAL | Status: DC

## 2014-08-11 MED ORDER — FLUCONAZOLE 150 MG PO TABS: 150.0000 mg | ORAL_TABLET | Freq: Once | ORAL | Status: DC

## 2014-08-11 NOTE — Progress Notes (Signed)
Patient reports a lot of vaginal itching since starting the antibiotics; Alvi present for interpreter

## 2014-08-11 NOTE — Addendum Note (Signed)
Addended by: Kathee DeltonHILLMAN, [place] Bynum L on: 08/11/2014 11:51 AM   Modules accepted: Orders

## 2014-08-11 NOTE — Progress Notes (Signed)
C/O vaginal itching with discharge. Exam c/w yeast. Wet prep sent. Rx diflucan with Terazol for external use.

## 2014-08-11 NOTE — Patient Instructions (Signed)
Third Trimester of Pregnancy The third trimester is from week 29 through week 42, months 7 through 9. The third trimester is a time when the fetus is growing rapidly. At the end of the ninth month, the fetus is about 20 inches in length and weighs 6-10 pounds.  BODY CHANGES Your body goes through many changes during pregnancy. The changes vary from woman to woman.   Your weight will continue to increase. You can expect to gain 25-35 pounds (11-16 kg) by the end of the pregnancy.  You may begin to get stretch marks on your hips, abdomen, and breasts.  You may urinate more often because the fetus is moving lower into your pelvis and pressing on your bladder.  You may develop or continue to have heartburn as a result of your pregnancy.  You may develop constipation because certain hormones are causing the muscles that push waste through your intestines to slow down.  You may develop hemorrhoids or swollen, bulging veins (varicose veins).  You may have pelvic pain because of the weight gain and pregnancy hormones relaxing your joints between the bones in your pelvis. Backaches may result from overexertion of the muscles supporting your posture.  You may have changes in your hair. These can include thickening of your hair, rapid growth, and changes in texture. Some women also have hair loss during or after pregnancy, or hair that feels dry or thin. Your hair will most likely return to normal after your baby is born.  Your breasts will continue to grow and be tender. A yellow discharge may leak from your breasts called colostrum.  Your belly button may stick out.  You may feel short of breath because of your expanding uterus.  You may notice the fetus "dropping," or moving lower in your abdomen.  You may have a bloody mucus discharge. This usually occurs a few days to a week before labor begins.  Your cervix becomes thin and soft (effaced) near your due date. WHAT TO EXPECT AT YOUR PRENATAL  EXAMS  You will have prenatal exams every 2 weeks until week 36. Then, you will have weekly prenatal exams. During a routine prenatal visit:  You will be weighed to make sure you and the fetus are growing normally.  Your blood pressure is taken.  Your abdomen will be measured to track your baby's growth.  The fetal heartbeat will be listened to.  Any test results from the previous visit will be discussed.  You may have a cervical check near your due date to see if you have effaced. At around 36 weeks, your caregiver will check your cervix. At the same time, your caregiver will also perform a test on the secretions of the vaginal tissue. This test is to determine if a type of bacteria, Group B streptococcus, is present. Your caregiver will explain this further. Your caregiver may ask you:  What your birth plan is.  How you are feeling.  If you are feeling the baby move.  If you have had any abnormal symptoms, such as leaking fluid, bleeding, severe headaches, or abdominal cramping.  If you have any questions. Other tests or screenings that may be performed during your third trimester include:  Blood tests that check for low iron levels (anemia).  Fetal testing to check the health, activity level, and growth of the fetus. Testing is done if you have certain medical conditions or if there are problems during the pregnancy. FALSE LABOR You may feel small, irregular contractions that   eventually go away. These are called Braxton Hicks contractions, or false labor. Contractions may last for hours, days, or even weeks before true labor sets in. If contractions come at regular intervals, intensify, or become painful, it is best to be seen by your caregiver.  SIGNS OF LABOR   Menstrual-like cramps.  Contractions that are 5 minutes apart or less.  Contractions that start on the top of the uterus and spread down to the lower abdomen and back.  A sense of increased pelvic pressure or back  pain.  A watery or bloody mucus discharge that comes from the vagina. If you have any of these signs before the 37th week of pregnancy, call your caregiver right away. You need to go to the hospital to get checked immediately. HOME CARE INSTRUCTIONS   Avoid all smoking, herbs, alcohol, and unprescribed drugs. These chemicals affect the formation and growth of the baby.  Follow your caregiver's instructions regarding medicine use. There are medicines that are either safe or unsafe to take during pregnancy.  Exercise only as directed by your caregiver. Experiencing uterine cramps is a good sign to stop exercising.  Continue to eat regular, healthy meals.  Wear a good support bra for breast tenderness.  Do not use hot tubs, steam rooms, or saunas.  Wear your seat belt at all times when driving.  Avoid raw meat, uncooked cheese, cat litter boxes, and soil used by cats. These carry germs that can cause birth defects in the baby.  Take your prenatal vitamins.  Try taking a stool softener (if your caregiver approves) if you develop constipation. Eat more high-fiber foods, such as fresh vegetables or fruit and whole grains. Drink plenty of fluids to keep your urine clear or pale yellow.  Take warm sitz baths to soothe any pain or discomfort caused by hemorrhoids. Use hemorrhoid cream if your caregiver approves.  If you develop varicose veins, wear support hose. Elevate your feet for 15 minutes, 3-4 times a day. Limit salt in your diet.  Avoid heavy lifting, wear low heal shoes, and practice good posture.  Rest a lot with your legs elevated if you have leg cramps or low back pain.  Visit your dentist if you have not gone during your pregnancy. Use a soft toothbrush to brush your teeth and be gentle when you floss.  A sexual relationship may be continued unless your caregiver directs you otherwise.  Do not travel far distances unless it is absolutely necessary and only with the approval  of your caregiver.  Take prenatal classes to understand, practice, and ask questions about the labor and delivery.  Make a trial run to the hospital.  Pack your hospital bag.  Prepare the baby's nursery.  Continue to go to all your prenatal visits as directed by your caregiver. SEEK MEDICAL CARE IF:  You are unsure if you are in labor or if your water has broken.  You have dizziness.  You have mild pelvic cramps, pelvic pressure, or nagging pain in your abdominal area.  You have persistent nausea, vomiting, or diarrhea.  You have a bad smelling vaginal discharge.  You have pain with urination. SEEK IMMEDIATE MEDICAL CARE IF:   You have a fever.  You are leaking fluid from your vagina.  You have spotting or bleeding from your vagina.  You have severe abdominal cramping or pain.  You have rapid weight loss or gain.  You have shortness of breath with chest pain.  You notice sudden or extreme swelling   of your face, hands, ankles, feet, or legs.  You have not felt your baby move in over an hour.  You have severe headaches that do not go away with medicine.  You have vision changes. Document Released: 09/18/2001 Document Revised: 09/29/2013 Document Reviewed: 11/25/2012 ExitCare Patient Information 2015 [employer]. This information is not intended to replace advice given to you by your health care provider. Make sure you discuss any questions you have with your health care provider.  

## 2014-08-12 LAB — WET PREP, GENITAL: Trich, Wet Prep: NONE SEEN

## 2014-08-25 ENCOUNTER — Ambulatory Visit (INDEPENDENT_AMBULATORY_CARE_PROVIDER_SITE_OTHER): Payer: Self-pay | Admitting: Physician Assistant

## 2014-08-25 VITALS — BP 112/64 | HR 107 | Temp 98.3°F | Wt 158.0 lb

## 2014-08-25 DIAGNOSIS — O368390 Maternal care for abnormalities of the fetal heart rate or rhythm, unspecified trimester, not applicable or unspecified: Secondary | ICD-10-CM

## 2014-08-25 DIAGNOSIS — Z3493 Encounter for supervision of normal pregnancy, unspecified, third trimester: Secondary | ICD-10-CM

## 2014-08-25 NOTE — Progress Notes (Signed)
Used interpreter: Adriana Martinez 

## 2014-08-25 NOTE — Progress Notes (Signed)
33 weeks, stable with no complaints.  Denies vaginal bleeding, LOF, dysuria.  Discharge is normal.   NST today as FHR is tachy RTC 1 week ROB.   Spanish lang interpreter used

## 2014-08-25 NOTE — Patient Instructions (Signed)
Informacin sobre el parto prematuro  (Preterm Labor Information)  Se llama parto prematuro cuando se inicia antes de las 37 semanas de embarazo. La duracin de un embarazo normal es de 39 a 41 semanas.  CAUSAS  Generalmente las causas del parto prematuro no se conocen. La causa ms frecuente conocida es una infeccin.  FACTORES DE RIESGO   Historia previa de parto prematuro.  Romper la bolsa de aguas antes de tiempo.  La placenta cubre la abertura del cuello.  La placenta se despega del tero.  El cuello es demasiado dbil para contener al beb en el tero.  Hay mucho lquido en el saco amnitico.  Consumo de drogas o hbito de fumar durante el embarazo.  No aumentar de peso lo suficiente durante el embarazo.  Mujeres menores de 18 aos o mayores de 35 aos.  Tener bajos ingresos.  Pertenecer a la raza afroamericana. SNTOMAS   Clicos similares a los menstruales, dolor en el vientre (abdominal) o dolor en la espalda.  Contracciones regulares, tan frecuentes como seis en una hora. Pueden ser suaves o dolorosas.  Contracciones que comienzan en la parte superior del vientre. Luego bajan hacia la zona inferior del vientre y hacia la espalda.  Presin en la zona inferior del vientre que parece empeorar.  Sangrado que proviene de la vagina.  Prdida de lquido por la vagina. TRATAMIENTO  El tratamiento depende de:   Su estado.  El estado del beb.  Cuntas semanas tiene de embarazo. El mdico podr indicarle:   Medicamentos para detener las contracciones.  Que permanezca en la cama excepto para ir al bao (reposo en cama).  Que permanezca en el hospital. QU DEBE HACER SI PIENSA QUE EST EN TRABAJO DE PARTO PREMATURO?  Comunquese con su mdico de inmediato. Debe concurrir al hospital para ser controlada inmediatamente.  CMO PUEDE EVITAR EL TRABAJO DE PARTO PREMATURO EN FUTUROS EMBARAZOS?   Si fuma, abandone el hbito.  Mantenga un aumento de peso  saludable.  Notome drogas ni manipule sustancias qumicas que no necesita.  Informe a su mdico si piensa que tiene una infeccin.  Informe a su mdico si tuvo un trabajo de parto prematuro anteriormente. Document Released: 10/27/2010 Document Revised: 05/27/2013 ExitCare Patient Information 2015 [employer]. This information is not intended to replace advice given to you by your health care provider. Make sure you discuss any questions you have with your health care provider.  

## 2014-09-08 ENCOUNTER — Ambulatory Visit (INDEPENDENT_AMBULATORY_CARE_PROVIDER_SITE_OTHER): Payer: Self-pay | Admitting: Physician Assistant

## 2014-09-08 VITALS — BP 107/71 | HR 109 | Temp 98.4°F | Wt 160.6 lb

## 2014-09-08 DIAGNOSIS — Z3483 Encounter for supervision of other normal pregnancy, third trimester: Secondary | ICD-10-CM

## 2014-09-08 DIAGNOSIS — O3663X1 Maternal care for excessive fetal growth, third trimester, fetus 1: Secondary | ICD-10-CM

## 2014-09-08 LAB — POCT URINALYSIS DIP (DEVICE)
BILIRUBIN URINE: NEGATIVE
Glucose, UA: NEGATIVE mg/dL
Ketones, ur: NEGATIVE mg/dL
Nitrite: NEGATIVE
Protein, ur: NEGATIVE mg/dL
Specific Gravity, Urine: 1.015 (ref 1.005–1.030)
UROBILINOGEN UA: 0.2 mg/dL (ref 0.0–1.0)
pH: 6 (ref 5.0–8.0)

## 2014-09-08 NOTE — Progress Notes (Signed)
Used interpreter [employer].States took diflucan and it helped alittle, but feels like vaginal itching coming back. States did not buy terazol, because expensive.

## 2014-09-08 NOTE — Patient Instructions (Signed)
Contracciones de Braxton Hicks °(Braxton Hicks Contractions) °Durante el embarazo, pueden presentarse contracciones uterinas que no siempre indican que está en trabajo de parto.  °¿QUÉ SON LAS CONTRACCIONES DE BRAXTON HICKS?  °Las contracciones que se presentan antes del trabajo de parto se conocen como contracciones de Braxton Hicks o falso trabajo de parto. Hacia el final del embarazo (32 a 34 semanas), estas contracciones pueden aparecen con más frecuencia y volverse más intensas. No corresponden al trabajo de parto verdadero porque estas contracciones no producen el agrandamiento (la dilatación) y el afinamiento del cuello del útero. Algunas veces, es difícil distinguirlas del trabajo de parto verdadero porque en algunos casos pueden ser muy intensas, y las personas tienen diferentes niveles de tolerancia al dolor. No debe sentirse avergonzada si concurre al hospital con falso trabajo de parto. En ocasiones, la única forma de saber si el trabajo de parto es verdadero es que el médico determine si hay cambios en el cuello del útero. °Si no hay problemas prenatales u otras complicaciones de salud asociadas con el embarazo, no habrá inconvenientes si la envían a su casa con falso trabajo de parto y espera que comience el verdadero. °CÓMO DIFERENCIAR EL TRABAJO DE PARTO FALSO DEL VERDADERO °Falso trabajo de parto °· Las contracciones del falso trabajo de parto duran menos y no son tan intensas como las verdaderas. °· Generalmente son irregulares. °· A menudo, se sienten en la parte delantera de la parte baja del abdomen y en la ingle, °· y pueden desaparecer cuando camina o cambia de posición mientras está acostada. °· Las contracciones se vuelven más débiles y su duración es menor a medida que el tiempo transcurre. °· Por lo general, no se hacen progresivamente más intensas, regulares y cercanas entre sí como en el caso del trabajo de parto verdadero. °Verdadero trabajo de parto °· Las contracciones del verdadero  trabajo de parto duran de 30 a 70 segundos, son muy regulares y suelen volverse más intensas, y aumenta su frecuencia. °· No desaparecen cuando camina. °· La molestia generalmente se siente en la parte superior del útero y se extiende hacia la zona inferior del abdomen y hacia la cintura. °· El médico podrá examinarla para determinar si el trabajo de parto es verdadero. El examen mostrará si el cuello del útero se está dilatando y afinando. °LO QUE DEBE RECORDAR °· Continúe haciendo los ejercicios habituales y siga otras indicaciones que el médico le dé. °· Tome todos los medicamentos como le indicó el médico. °· Concurra a las visitas prenatales regulares. °· Coma y beba con moderación si cree que está en trabajo de parto. °· Si las contracciones de Braxton Hicks le provocan incomodidad: °¨ Cambie de posición: si está acostada o descansando, camine; si está caminando, descanse. °¨ Siéntese y descanse en una bañera con agua tibia. °¨ Beba 2 o 3 vasos de agua. La deshidratación puede provocar contracciones. °¨ Respire lenta y profundamente varias veces por hora. °¿CUÁNDO DEBO BUSCAR ASISTENCIA MÉDICA INMEDIATA? °Solicite atención médica de inmediato si: °· Las contracciones se intensifican, se hacen más regulares y cercanas entre sí. °· Tiene una pérdida de líquido por la vagina. °· Tiene fiebre. °· Elimina mucosidad manchada con sangre. °· Tiene una hemorragia vaginal abundante. °· Tiene dolor abdominal permanente. °· Tiene un dolor en la zona lumbar que nunca tuvo antes. °· Siente que la cabeza del bebé empuja hacia abajo y ejerce presión en la zona pélvica. °· El bebé no se mueve tanto como solía. °Document Released: 07/04/2005 Document Revised: 09/29/2013 °ExitCare® Patient   Information ©2015 [employer]. This information is not intended to replace advice given to you by your health care provider. Make sure you discuss any questions you have with your health care provider. ° °

## 2014-09-08 NOTE — Progress Notes (Signed)
35 weeks.  No complaints.  Endorses good fetal movement.  Denies vaginal bleeding, dysuria, LOF.   Size less than dates Will obtain U/S RTC 1 week

## 2014-09-09 ENCOUNTER — Ambulatory Visit (HOSPITAL_COMMUNITY)
Admission: RE | Admit: 2014-09-09 | Discharge: 2014-09-09 | Disposition: A | Discharge status: Home / Self Care | Payer: Self-pay | Source: Ambulatory Visit | Attending: Physician Assistant | Admitting: Physician Assistant

## 2014-09-09 DIAGNOSIS — Z3A35 35 weeks gestation of pregnancy: Secondary | ICD-10-CM | POA: Insufficient documentation

## 2014-09-09 DIAGNOSIS — O365930 Maternal care for other known or suspected poor fetal growth, third trimester, not applicable or unspecified: Secondary | ICD-10-CM | POA: Insufficient documentation

## 2014-09-09 DIAGNOSIS — O3663X1 Maternal care for excessive fetal growth, third trimester, fetus 1: Secondary | ICD-10-CM

## 2014-09-09 DIAGNOSIS — O26849 Uterine size-date discrepancy, unspecified trimester: Secondary | ICD-10-CM | POA: Insufficient documentation

## 2014-09-10 LAB — CULTURE, OB URINE: Colony Count: >=100000

## 2014-09-15 ENCOUNTER — Ambulatory Visit (INDEPENDENT_AMBULATORY_CARE_PROVIDER_SITE_OTHER): Payer: Self-pay | Admitting: Advanced Practice Midwife

## 2014-09-15 VITALS — BP 105/58 | HR 98 | Temp 98.0°F | Wt 162.0 lb

## 2014-09-15 DIAGNOSIS — Z3483 Encounter for supervision of other normal pregnancy, third trimester: Secondary | ICD-10-CM

## 2014-09-15 LAB — OB RESULTS CONSOLE GBS: GBS: NEGATIVE

## 2014-09-15 LAB — POCT URINALYSIS DIP (DEVICE)
Bilirubin Urine: NEGATIVE
Glucose, UA: NEGATIVE mg/dL
Ketones, ur: NEGATIVE mg/dL
NITRITE: NEGATIVE
PROTEIN: 30 mg/dL — AB
Specific Gravity, Urine: 1.025 (ref 1.005–1.030)
Urobilinogen, UA: 0.2 mg/dL (ref 0.0–1.0)
pH: 5.5 (ref 5.0–8.0)

## 2014-09-15 LAB — OB RESULTS CONSOLE GC/CHLAMYDIA
Chlamydia: NEGATIVE
GC PROBE AMP, GENITAL: NEGATIVE

## 2014-09-15 NOTE — Progress Notes (Signed)
C/o of intermittent pelvic pressure and occasional contractions.  C/o of vaginal itching with white discharge-- reports itching comes and goes.  Reports occasional edema in hands and feet.

## 2014-09-15 NOTE — Patient Instructions (Signed)
Tercer trimestre de embarazo (Third Trimester of Pregnancy) El tercer trimestre va desde la semana29 hasta la 42, desde el sptimo hasta el noveno mes, y es la poca en la que el feto crece ms rpidamente. Hacia el final del noveno mes, el feto mide alrededor de 20pulgadas (45cm) de largo y pesa entre 6 y 10 libras (2,700 y 4,500kg).  CAMBIOS EN EL ORGANISMO Su organismo atraviesa por muchos cambios durante el embarazo, y estos varan de una mujer a otra.   Seguir aumentando de peso. Es de esperar que aumente entre 25 y 35libras (11 y 16kg) hacia el final del embarazo.  Podrn aparecer las primeras estras en las caderas, el abdomen y las mamas.  Puede tener necesidad de orinar con ms frecuencia porque el feto baja hacia la pelvis y ejerce presin sobre la vejiga.  Debido al embarazo podr sentir acidez estomacal con frecuencia.  Puede estar estreida, ya que ciertas hormonas enlentecen los movimientos de los msculos que empujan los desechos a travs de los intestinos.  Pueden aparecer hemorroides o abultarse e hincharse las venas (venas varicosas).  Puede sentir dolor plvico debido al aumento de peso y a que las hormonas del embarazo relajan las articulaciones entre los huesos de la pelvis. El dolor de espalda puede ser consecuencia de la sobrecarga de los msculos que soportan la postura.  Tal vez haya cambios en el cabello que pueden incluir su engrosamiento, crecimiento rpido y cambios en la textura. Adems, a algunas mujeres se les cae el cabello durante o despus del embarazo, o tienen el cabello seco o fino. Lo ms probable es que el cabello se le normalice despus del nacimiento del beb.  Las mamas seguirn creciendo y le dolern. A veces, puede haber una secrecin amarilla de las mamas llamada calostro.  El ombligo puede salir hacia afuera.  Puede sentir que le falta el aire debido a que se expande el tero.  Puede notar que el feto "baja" o lo siente ms bajo, en el  abdomen.  Puede tener una prdida de secrecin mucosa con sangre. Esto suele ocurrir en el trmino de unos pocos das a una semana antes de que comience el trabajo de parto.  El cuello del tero se vuelve delgado y blando (se borra) cerca de la fecha de parto. QU DEBE ESPERAR EN LOS EXMENES PRENATALES  Le harn exmenes prenatales cada 2semanas hasta la semana36. A partir de ese momento le harn exmenes semanales. Durante una visita prenatal de rutina:  La pesarn para asegurarse de que usted y el feto estn creciendo normalmente.  Le tomarn la presin arterial.  Le medirn el abdomen para controlar el desarrollo del beb.  Se escucharn los latidos cardacos fetales.  Se evaluarn los resultados de los estudios solicitados en visitas anteriores.  Le revisarn el cuello del tero cuando est prxima la fecha de parto para controlar si este se ha borrado. Alrededor de la semana36, el mdico le revisar el cuello del tero. Al mismo tiempo, realizar un anlisis de las secreciones del tejido vaginal. Este examen es para determinar si hay un tipo de bacteria, estreptococo Grupo B. El mdico le explicar esto con ms detalle. El mdico puede preguntarle lo siguiente:  Cmo le gustara que fuera el parto.  Cmo se siente.  Si siente los movimientos del beb.  Si ha tenido sntomas anormales, como prdida de lquido, sangrado, dolores de cabeza intensos o clicos abdominales.  Si tiene alguna pregunta. Otros exmenes o estudios de deteccin que pueden realizarse   durante el tercer trimestre incluyen lo siguiente:  Anlisis de sangre para controlar las concentraciones de hierro (anemia).  Controles fetales para determinar su salud, nivel de actividad y crecimiento. Si tiene alguna enfermedad o hay problemas durante el embarazo, le harn estudios. FALSO TRABAJO DE PARTO Es posible que sienta contracciones leves e irregulares que finalmente desaparecen. Se llaman contracciones de  Braxton Hicks o falso trabajo de parto. Las contracciones pueden durar horas, das o incluso semanas, antes de que el verdadero trabajo de parto se inicie. Si las contracciones ocurren a intervalos regulares, se intensifican o se hacen dolorosas, lo mejor es que la revise el mdico.  SIGNOS DE TRABAJO DE PARTO   Clicos de tipo menstrual.  Contracciones cada 5minutos o menos.  Contracciones que comienzan en la parte superior del tero y se extienden hacia abajo, a la zona inferior del abdomen y la espalda.  Sensacin de mayor presin en la pelvis o dolor de espalda.  Una secrecin de mucosidad acuosa o con sangre que sale de la vagina. Si tiene alguno de estos signos antes de la semana37 del embarazo, llame a su mdico de inmediato. Debe concurrir al hospital para que la controlen inmediatamente. INSTRUCCIONES PARA EL CUIDADO EN EL HOGAR   Evite fumar, consumir hierbas, beber alcohol y tomar frmacos que no le hayan recetado. Estas sustancias qumicas afectan la formacin y el desarrollo del beb.  Siga las indicaciones del mdico en relacin con el uso de medicamentos. Durante el embarazo, hay medicamentos que son seguros de tomar y otros que no.  Haga actividad fsica solo en la forma indicada por el mdico. Sentir clicos uterinos es un buen signo para detener la actividad fsica.  Contine comiendo alimentos que sanos con regularidad.  Use un sostn que le brinde buen soporte si le duelen las mamas.  No se d baos de inmersin en agua caliente, baos turcos ni saunas.  Colquese el cinturn de seguridad cuando conduzca.  No coma carne cruda ni queso sin cocinar; evite el contacto con las bandejas sanitarias de los gatos y la tierra que estos animales usan. Estos elementos contienen grmenes que pueden causar defectos congnitos en el beb.  Tome las vitaminas prenatales.  Si est estreida, pruebe un laxante suave (si el mdico lo autoriza). Consuma ms alimentos ricos en  fibra, como vegetales y frutas frescos y cereales integrales. Beba gran cantidad de lquido para mantener la orina de tono claro o color amarillo plido.  Dese baos de asiento con agua tibia para aliviar el dolor o las molestias causadas por las hemorroides. Use una crema para las hemorroides si el mdico la autoriza.  Si tiene venas varicosas, use medias de descanso. Eleve los pies durante 15minutos, 3 o 4veces por da. Limite la cantidad de sal en su dieta.  Evite levantar objetos pesados, use zapatos de tacones bajos y mantenga una buena postura.  Descanse con las piernas elevadas si tiene calambres o dolor de cintura.  Visite a su dentista si no lo ha hecho durante el embarazo. Use un cepillo de dientes blando para higienizarse los dientes y psese el hilo dental con suavidad.  Puede seguir manteniendo relaciones sexuales, a menos que el mdico le indique lo contrario.  No haga viajes largos excepto que sea absolutamente necesario y solo con la autorizacin del mdico.  Tome clases prenatales para entender, practicar y hacer preguntas sobre el trabajo de parto y el parto.  Haga un ensayo de la partida al hospital.  Prepare el bolso que   llevar al hospital.  Prepare la habitacin del beb.  Concurra a todas las visitas prenatales segn las indicaciones de su mdico. SOLICITE ATENCIN MDICA SI:  No est segura de que est en trabajo de parto o de que ha roto la bolsa de las aguas.  Tiene mareos.  Siente clicos leves, presin en la pelvis o dolor persistente en el abdomen.  Tiene nuseas, vmitos o diarrea persistentes.  Tiene secrecin vaginal con mal olor.  Siente dolor al orinar. SOLICITE ATENCIN MDICA DE INMEDIATO SI:   Tiene fiebre.  Tiene una prdida de lquido por la vagina.  Tiene sangrado o pequeas prdidas vaginales.  Siente dolor intenso o clicos en el abdomen.  Sube o baja de peso rpidamente.  Tiene dificultad para respirar y siente dolor de  pecho.  Sbitamente se le hinchan mucho el rostro, las manos, los tobillos, los pies o las piernas.  No ha sentido los movimientos del beb durante una hora.  Siente un dolor de cabeza intenso que no se alivia con medicamentos.  Hay cambios en la visin. Document Released: 07/04/2005 Document Revised: 09/29/2013 [employer] Patient Information 2015 [employer]. This information is not intended to replace advice given to you by your health care provider. Make sure you discuss any questions you have with your health care provider.  

## 2014-09-15 NOTE — Progress Notes (Signed)
Doing well. US last week showed 70%ile growth. Cultures done

## 2014-09-16 LAB — GC/CHLAMYDIA PROBE AMP
CT Probe RNA: NEGATIVE
GC Probe RNA: NEGATIVE

## 2014-09-17 LAB — CULTURE, BETA STREP (GROUP B ONLY)

## 2014-09-22 ENCOUNTER — Ambulatory Visit (INDEPENDENT_AMBULATORY_CARE_PROVIDER_SITE_OTHER): Payer: Self-pay | Admitting: Advanced Practice Midwife

## 2014-09-22 VITALS — BP 106/60 | HR 81 | Temp 98.0°F | Wt 162.2 lb

## 2014-09-22 DIAGNOSIS — Z3493 Encounter for supervision of normal pregnancy, unspecified, third trimester: Secondary | ICD-10-CM

## 2014-09-22 DIAGNOSIS — B373 Candidiasis of vulva and vagina: Secondary | ICD-10-CM

## 2014-09-22 DIAGNOSIS — B3731 Acute candidiasis of vulva and vagina: Secondary | ICD-10-CM

## 2014-09-22 LAB — POCT URINALYSIS DIP (DEVICE)
BILIRUBIN URINE: NEGATIVE
Glucose, UA: NEGATIVE mg/dL
KETONES UR: NEGATIVE mg/dL
NITRITE: NEGATIVE
Protein, ur: NEGATIVE mg/dL
Specific Gravity, Urine: 1.01 (ref 1.005–1.030)
Urobilinogen, UA: 0.2 mg/dL (ref 0.0–1.0)
pH: 6 (ref 5.0–8.0)

## 2014-09-22 NOTE — Patient Instructions (Signed)
Monistat 7   Vaginitis monilisica (Monilial Vaginitis) La vaginitis es una inflamacin (irritacin, hinchazn) de la vagina y la vulva. Esta no es una enfermedad de transmisin sexual.  CAUSAS Este tipo de vaginitis lo causa un hongo (candida) que normalmente se encuentra en la vagina. El hongo candida se ha desarrollado hasta el punto de ocasionar problemas en el equilibrio qumico. SNTOMAS  Secrecin vaginal espesa y blanca.  Hinchazn, picazn, enrojecimiento e inflamacin de la vagina y en algunos casos de los labios vaginales (vulva).  Ardor o dolor al [employer].  Dolor en las relaciones sexuales. DIAGNSTICO Los factores que favorecen la vaginitis moniliasica son: 1. [place]apas de virginidad y postmenopusicas. 2. Beth Russell. 3. Infecciones. 4. Sentir cansancio, estar enferma o estresada, especialmente si ya ha sufrido este problema en el pasado. 5. Diabetes Buen control ayudar a disminur la probabilidad. 6. Pldoras anticonceptivas 7. Ropa interior Beth Russell. 8. El uso de espumas de bao, aerosoles femeninos duchas vaginales o tampones con desodorante. 9. Algunos antibiticos (medicamentos que destruyen grmenes). 10. Si contrae alguna enfermedad puede sufrir recurrencias espordicas. TRATAMIENTO El profesional que lo asiste prescribir medicamentos.  Hay diferentes tipos de cremas y supositorios vaginales que tratan especficamente la vaginitis monilisica. Para infecciones por hongos recurrentes, utilice un supositorio o crema en la vagina dos veces por semana, o segn se le indique.  Tambin podrn utilizarse cremas con corticoides o anti monilisicas para la picazn o la irritacin de la vulva. Consulte con el profesional que la asiste.  Si la crema no da resultado, podr aplicarse en la vagina una solucin con azul de metileno.  El consumo de yogur puede prevenir este tipo de vaginitis. INSTRUCCIONES PARA EL CUIDADO DOMICILIARIO  Tome todos los medicamentos tal como  se le indic.  No mantenga relaciones sexuales hasta que el tratamiento se haya completado, o segn las indicaciones del profesional que la asiste.  Tome baos de asiento tibios.  No se aplique duchas vaginales.  No utilice tampones, especialmente los perfumados.  Use ropa interior de algodn  [employer] pantalones ajustados y las medias tipo panty.  Comunique a sus compaeros sexuales que sufre una infeccin por hongos. Ellos deben concurrir para un control mdico si tienen sntomas como una urticaria leve o picazn.  Sus compaeros sexuales deben tratarse tambin si la infeccin es difcil de Pharmacologist.  Practique el sexo seguro - use condones  Algunos medicamentos vaginales ocasionan fallas en los condones de ltex. Los medicamentos vaginales que pueden daar los condones son:  Chiropodist cleocina  Butoconazole (Femstat)  Terconazole (Terazol) supositorios vaginales  Miconazole (Monistat) (es un medicamento de venta libre) SOLICITE ATENCIN MDICA SI:  Beth Russell tiene una temperatura oral de ms de 38,9 C (102 F).  Si la infeccin empeora luego de 2 [address]  Si la infeccin no mejora luego de 3 [address].  Aparecen ampollas en o alrededor de la vagina.  Si aparece una hemorragia vaginal y no es el momento del perodo.  Siente dolor al [employer].  Presenta problemas intestinales.  Tiene dolor durante las [employer]. Document Released: 07/04/2005 Document Revised: 12/17/2011 [facility] Patient Information 2015 [place], [place]. This information is not intended to replace advice given to you by your health care provider. Make sure you discuss any questions you have with your health care provider.  Fetal Movement Counts Patient Name: __________________________________________________ Patient Due Date: ____________________ Performing a fetal movement count is highly recommended in high-risk pregnancies, but it is good for every pregnant woman to  do. Your health  care provider may ask you to start counting fetal movements at 28 weeks of the pregnancy. Fetal movements often increase:  After eating a full meal.  After physical activity.  After eating or drinking something sweet or cold.  At rest. Pay attention to when you feel the baby is most active. This will help you notice a pattern of your baby's sleep and wake cycles and what factors contribute to an increase in fetal movement. It is important to perform a fetal movement count at the same time each day when your baby is normally most active.  HOW TO COUNT FETAL MOVEMENTS 11. Find a quiet and comfortable area to sit or lie down on your left side. Lying on your left side provides the best blood and oxygen circulation to your baby. 12. Write down the day and time on a sheet of paper or in a journal. 13. Start counting kicks, flutters, swishes, rolls, or jabs in a 2-hour period. You should feel at least 10 movements within 2 hours. 14. If you do not feel 10 movements in 2 hours, wait 2-3 hours and count again. Look for a change in the pattern or not enough counts in 2 hours. SEEK MEDICAL CARE IF:  You feel less than 10 counts in 2 hours, tried twice.  There is no movement in over an hour.  The pattern is changing or taking longer each day to reach 10 counts in 2 hours.  You feel the baby is not moving as he or she usually does. Date: ____________ Movements: ____________ Start time: ____________ Doreatha MartinFinish time: ____________  Date: ____________ Movements: ____________ Start time: ____________ Doreatha MartinFinish time: ____________ Date: ____________ Movements: ____________ Start time: ____________ Doreatha MartinFinish time: ____________ Date: ____________ Movements: ____________ Start time: ____________ Doreatha MartinFinish time: ____________ Date: ____________ Movements: ____________ Start time: ____________ Doreatha MartinFinish time: ____________ Date: ____________ Movements: ____________ Start time: ____________ Doreatha MartinFinish time:  ____________ Date: ____________ Movements: ____________ Start time: ____________ Doreatha MartinFinish time: ____________ Date: ____________ Movements: ____________ Start time: ____________ Doreatha MartinFinish time: ____________  Date: ____________ Movements: ____________ Start time: ____________ Doreatha MartinFinish time: ____________ Date: ____________ Movements: ____________ Start time: ____________ Doreatha MartinFinish time: ____________ Date: ____________ Movements: ____________ Start time: ____________ Doreatha MartinFinish time: ____________ Date: ____________ Movements: ____________ Start time: ____________ Doreatha MartinFinish time: ____________ Date: ____________ Movements: ____________ Start time: ____________ Doreatha MartinFinish time: ____________ Date: ____________ Movements: ____________ Start time: ____________ Doreatha MartinFinish time: ____________ Date: ____________ Movements: ____________ Start time: ____________ Doreatha MartinFinish time: ____________  Date: ____________ Movements: ____________ Start time: ____________ Doreatha MartinFinish time: ____________ Date: ____________ Movements: ____________ Start time: ____________ Doreatha MartinFinish time: ____________ Date: ____________ Movements: ____________ Start time: ____________ Doreatha MartinFinish time: ____________ Date: ____________ Movements: ____________ Start time: ____________ Doreatha MartinFinish time: ____________ Date: ____________ Movements: ____________ Start time: ____________ Doreatha MartinFinish time: ____________ Date: ____________ Movements: ____________ Start time: ____________ Doreatha MartinFinish time: ____________ Date: ____________ Movements: ____________ Start time: ____________ Doreatha MartinFinish time: ____________  Date: ____________ Movements: ____________ Start time: ____________ Doreatha MartinFinish time: ____________ Date: ____________ Movements: ____________ Start time: ____________ Doreatha MartinFinish time: ____________ Date: ____________ Movements: ____________ Start time: ____________ Doreatha MartinFinish time: ____________ Date: ____________ Movements: ____________ Start time: ____________ Doreatha MartinFinish time: ____________ Date: ____________ Movements:  ____________ Start time: ____________ Doreatha MartinFinish time: ____________ Date: ____________ Movements: ____________ Start time: ____________ Doreatha MartinFinish time: ____________ Date: ____________ Movements: ____________ Start time: ____________ Doreatha MartinFinish time: ____________  Date: ____________ Movements: ____________ Start time: ____________ Doreatha MartinFinish time: ____________ Date: ____________ Movements: ____________ Start time: ____________ Doreatha MartinFinish time: ____________ Date: ____________ Movements: ____________ Start time: ____________ Doreatha MartinFinish time: ____________ Date: ____________ Movements: ____________ Start time: ____________ Doreatha MartinFinish time: ____________ Date:  ____________ Movements: ____________ Start time: ____________ Doreatha MartinFinish time: ____________ Date: ____________ Movements: ____________ Start time: ____________ Doreatha MartinFinish time: ____________ Date: ____________ Movements: ____________ Start time: ____________ Doreatha MartinFinish time: ____________  Date: ____________ Movements: ____________ Start time: ____________ Doreatha MartinFinish time: ____________ Date: ____________ Movements: ____________ Start time: ____________ Doreatha MartinFinish time: ____________ Date: ____________ Movements: ____________ Start time: ____________ Doreatha MartinFinish time: ____________ Date: ____________ Movements: ____________ Start time: ____________ Doreatha MartinFinish time: ____________ Date: ____________ Movements: ____________ Start time: ____________ Doreatha MartinFinish time: ____________ Date: ____________ Movements: ____________ Start time: ____________ Doreatha MartinFinish time: ____________ Date: ____________ Movements: ____________ Start time: ____________ Doreatha MartinFinish time: ____________  Date: ____________ Movements: ____________ Start time: ____________ Doreatha MartinFinish time: ____________ Date: ____________ Movements: ____________ Start time: ____________ Doreatha MartinFinish time: ____________ Date: ____________ Movements: ____________ Start time: ____________ Doreatha MartinFinish time: ____________ Date: ____________ Movements: ____________ Start time: ____________ Doreatha MartinFinish  time: ____________ Date: ____________ Movements: ____________ Start time: ____________ Doreatha MartinFinish time: ____________ Date: ____________ Movements: ____________ Start time: ____________ Doreatha MartinFinish time: ____________ Date: ____________ Movements: ____________ Start time: ____________ Doreatha MartinFinish time: ____________  Date: ____________ Movements: ____________ Start time: ____________ Doreatha MartinFinish time: ____________ Date: ____________ Movements: ____________ Start time: ____________ Doreatha MartinFinish time: ____________ Date: ____________ Movements: ____________ Start time: ____________ Doreatha MartinFinish time: ____________ Date: ____________ Movements: ____________ Start time: ____________ Doreatha MartinFinish time: ____________ Date: ____________ Movements: ____________ Start time: ____________ Doreatha MartinFinish time: ____________ Date: ____________ Movements: ____________ Start time: ____________ Doreatha MartinFinish time: ____________ Document Released: 10/24/2006 Document Revised: 02/08/2014 Document Reviewed: 07/21/2012 [employer] Patient Information 2015 [employer]. This information is not intended to replace advice given to you by your health care provider. Make sure you discuss any questions you have with your health care provider.

## 2014-09-22 NOTE — Progress Notes (Signed)
Large amount of curd-like discharge. Rec Monistat 7.

## 2014-09-29 ENCOUNTER — Ambulatory Visit (INDEPENDENT_AMBULATORY_CARE_PROVIDER_SITE_OTHER): Payer: Self-pay | Admitting: Physician Assistant

## 2014-09-29 ENCOUNTER — Encounter: Payer: Self-pay | Admitting: Physician Assistant

## 2014-09-29 VITALS — BP 119/64 | HR 98 | Temp 98.7°F | Wt 166.1 lb

## 2014-09-29 DIAGNOSIS — Z3493 Encounter for supervision of normal pregnancy, unspecified, third trimester: Secondary | ICD-10-CM

## 2014-09-29 LAB — POCT URINALYSIS DIP (DEVICE)
Bilirubin Urine: NEGATIVE
GLUCOSE, UA: NEGATIVE mg/dL
Ketones, ur: NEGATIVE mg/dL
Nitrite: NEGATIVE
Protein, ur: NEGATIVE mg/dL
SPECIFIC GRAVITY, URINE: 1.015 (ref 1.005–1.030)
UROBILINOGEN UA: 0.2 mg/dL (ref 0.0–1.0)
pH: 6 (ref 5.0–8.0)

## 2014-09-29 NOTE — Patient Instructions (Signed)
Lactancia materna (Breastfeeding) Decidir amamantar es una de las mejores elecciones que puede hacer por usted y su beb. El cambio hormonal durante el embarazo produce el desarrollo del tejido mamario y aumenta la cantidad y el tamao de los conductos galactforos. Estas hormonas tambin permiten que las protenas, los azcares y las grasas de la sangre produzcan la leche materna en las glndulas productoras de leche. Las hormonas impiden que la leche materna sea liberada antes del nacimiento del beb, adems de impulsar el flujo de leche luego del nacimiento. Una vez que ha comenzado a amamantar, pensar en el beb, as como la succin o el llanto, pueden estimular la liberacin de leche de las glndulas productoras de leche.  LOS BENEFICIOS DE AMAMANTAR Para el beb  La primera leche (calostro) ayuda a mejorar el funcionamiento del sistema digestivo del beb.  La leche tiene anticuerpos que ayudan a prevenir las infecciones en el beb.  El beb tiene una menor incidencia de asma, alergias y del sndrome de muerte sbita del lactante.  Los nutrientes en la leche materna son mejores para el beb que la leche maternizada y estn preparados exclusivamente para cubrir las necesidades del beb.  La leche materna mejora el desarrollo cerebral del beb.  Es menos probable que el beb desarrolle otras enfermedades, como obesidad infantil, asma o diabetes mellitus de tipo 2. Para usted   La lactancia materna favorece el desarrollo de un vnculo muy especial entre la madre y el beb.  Es conveniente. La leche materna siempre est disponible a la temperatura correcta y es econmica.  La lactancia materna ayuda a quemar caloras y a perder el peso ganado durante el embarazo.  Favorece la contraccin del tero al tamao que tena antes del embarazo de manera ms rpida y disminuye el sangrado (loquios) despus del parto.  La lactancia materna contribuye a reducir el riesgo de desarrollar diabetes  mellitus de tipo 2, osteoporosis o cncer de mama o de ovario en el futuro. SIGNOS DE QUE EL BEB EST HAMBRIENTO Primeros signos de hambre  Aumenta su estado de alerta o actividad.  Se estira.  Mueve la cabeza de un lado a otro.  Mueve la cabeza y abre la boca cuando se le toca la mejilla o la comisura de la boca (reflejo de bsqueda).  Aumenta las vocalizaciones, tales como sonidos de succin, se relame los labios, emite arrullos, suspiros, o chirridos.  Mueve la mano hacia la boca.  Se chupa con ganas los dedos o las manos. Signos tardos de hambre  Est agitado.  Llora de manera intermitente. Signos de hambre extrema Los signos de hambre extrema requerirn que lo calme y lo consuele antes de que el beb pueda alimentarse adecuadamente. No espere a que se manifiesten los siguientes signos de hambre extrema para comenzar a amamantar:   Agitacin.  Llanto intenso y fuerte.   Gritos. INFORMACIN BSICA SOBRE LA LACTANCIA MATERNA Iniciacin de la lactancia materna  Encuentre un lugar cmodo para sentarse o acostarse, con un buen respaldo para el cuello y la espalda.  Coloque una almohada o una manta enrollada debajo del beb para acomodarlo a la altura de la mama (si est sentada). Las almohadas para amamantar se han diseado especialmente a fin de servir de apoyo para los brazos y el beb mientras amamanta.  Asegrese de que el abdomen del beb est frente al suyo.  Masajee suavemente la mama. Con las yemas de los dedos, masajee la pared del pecho hacia el pezn en un movimiento circular.   Esto estimula el flujo de leche. Es posible que deba continuar este movimiento mientras amamanta si la leche fluye lentamente.  Sostenga la mama con el pulgar por arriba del pezn y los otros 4 dedos por debajo de la mama. Asegrese de que los dedos se encuentren lejos del pezn y de la boca del beb.  Empuje suavemente los labios del beb con el pezn o con el dedo.  Cuando la boca del  beb se abra lo suficiente, acrquelo rpidamente a la mama e introduzca todo el pezn y la zona oscura que lo rodea (areola), tanto como sea posible, dentro de la boca del beb.  Debe haber ms areola visible por arriba del labio superior del beb que por debajo del labio inferior.  La lengua del beb debe estar entre la enca inferior y la mama.  Asegrese de que la boca del beb est en la posicin correcta alrededor del pezn (prendida). Los labios del beb deben crear un sello sobre la mama y estar doblados hacia afuera (invertidos).  Es comn que el beb succione durante 2 a 3 minutos para que comience el flujo de leche materna. Cmo debe prenderse Es muy importante que le ensee al beb cmo prenderse adecuadamente a la mama. Si el beb no se prende adecuadamente, puede causarle dolor en el pezn y reducir la produccin de leche materna, y hacer que el beb tenga un escaso aumento de peso. Adems, si el beb no se prende adecuadamente al pezn, puede tragar aire durante la alimentacin. Esto puede causarle molestias al beb. Hacer eructar al beb al cambiar de mama puede ayudarlo a liberar el aire. Sin embargo, ensearle al beb cmo prenderse a la mama adecuadamente es la mejor manera de evitar que se sienta molesto por tragar aire mientras se alimenta. Signos de que el beb se ha prendido adecuadamente al pezn:   Tironea o succiona de modo silencioso, sin causarle dolor.  Se escucha que traga cada 3 o 4 succiones.   Hay movimientos musculares por arriba y por delante de sus odos al succionar. Signos de que el beb no se ha prendido adecuadamente al pezn:   Hace ruidos de succin o de chasquido mientras se alimenta.  Siente dolor en el pezn. Si cree que el beb no se prendi correctamente, deslice el dedo en la comisura de la boca y colquelo entre las encas del beb para interrumpir la succin. Intente comenzar a amamantar nuevamente. Signos de lactancia materna exitosa Signos  del beb:   Disminuye gradualmente el nmero de succiones o cesa la succin por completo.  Se duerme.  Relaja el cuerpo.  Retiene una pequea cantidad de leche en la boca.  Se desprende solo del pecho. Signos que presenta usted:  Las mamas han aumentado la firmeza, el peso y el tamao 1 a 3 horas despus de amamantar.  Estn ms blandas inmediatamente despus de amamantar.  Un aumento del volumen de leche, y tambin un cambio en su consistencia y color se producen hacia el quinto da de lactancia materna.  Los pezones no duelen, ni estn agrietados ni sangran. Signos de que su beb recibe la cantidad de leche suficiente  Moja al menos 3 paales en 24 horas. La orina debe ser clara y de color amarillo plido a los 5 das de vida.  Defeca al menos 3 veces en 24 horas a los 5 das de vida. La materia fecal debe ser blanda y amarillenta.  Defeca al menos 3 veces en 24 horas a   los 7 das de vida. La materia fecal debe ser grumosa y amarillenta.  No registra una prdida de peso mayor del 10% del peso al nacer durante los primeros 3 das de vida.  Aumenta de peso un promedio de 4 a 7onzas (113 a 198g) por semana despus de los 4 das de vida.  Aumenta de peso, diariamente, de manera uniforme a partir de los 5 das de vida, sin registrar prdida de peso despus de las 2semanas de vida. Despus de alimentarse, es posible que el beb regurgite una pequea cantidad. Esto es frecuente. FRECUENCIA Y DURACIN DE LA LACTANCIA MATERNA El amamantamiento frecuente la ayudar a producir ms leche y a prevenir problemas de dolor en los pezones e hinchazn en las mamas. Alimente al beb cuando muestre signos de hambre o si siente la necesidad de reducir la congestin de las mamas. Esto se denomina "lactancia a demanda". Evite el uso del chupete mientras trabaja para establecer la lactancia (las primeras 4 a 6 semanas despus del nacimiento del beb). Despus de este perodo, podr ofrecerle un  chupete. Las investigaciones demostraron que el uso del chupete durante el primer ao de vida del beb disminuye el riesgo de desarrollar el sndrome de muerte sbita del lactante (SMSL). Permita que el nio se alimente en cada mama todo lo que desee. Contine amamantando al beb hasta que haya terminado de alimentarse. Cuando el beb se desprende o se queda dormido mientras se est alimentando de la primera mama, ofrzcale la segunda. Debido a que, con frecuencia, los recin nacidos permanecen somnolientos las primeras semanas de vida, es posible que deba despertar al beb para alimentarlo. Los horarios de lactancia varan de un beb a otro. Sin embargo, las siguientes reglas pueden servir como gua para ayudarla a garantizar que el beb se alimenta adecuadamente:  Se puede amamantar a los recin nacidos (bebs de 4 semanas o menos de vida) cada 1 a 3 horas.  No deben transcurrir ms de 3 horas durante el da o 5 horas durante la noche sin que se amamante a los recin nacidos.  Debe amamantar al beb 8 veces como mnimo en un perodo de 24 horas, hasta que comience a introducir slidos en su dieta, a los 6 meses de vida aproximadamente. EXTRACCIN DE LECHE MATERNA La extraccin y el almacenamiento de la leche materna le permiten asegurarse de que el beb se alimente exclusivamente de leche materna, aun en momentos en los que no puede amamantar. Esto tiene especial importancia si debe regresar al trabajo en el perodo en que an est amamantando o si no puede estar presente en los momentos en que el beb debe alimentarse. Su asesor en lactancia puede orientarla sobre cunto tiempo es seguro almacenar leche materna.  El sacaleche es un aparato que le permite extraer leche de la mama a un recipiente estril. Luego, la leche materna extrada puede almacenarse en un refrigerador o congelador. Algunos sacaleches son manuales, mientras que otros son elctricos. Consulte a su asesor en lactancia qu tipo ser  ms conveniente para usted. Los sacaleches se pueden comprar; sin embargo, algunos hospitales y grupos de apoyo a la lactancia materna alquilan sacaleches mensualmente. Un asesor en lactancia puede ensearle cmo extraer leche materna manualmente, en caso de que prefiera no usar un sacaleche.  CMO CUIDAR LAS MAMAS DURANTE LA LACTANCIA MATERNA Los pezones se secan, agrietan y duelen durante la lactancia materna. Las siguientes recomendaciones pueden ayudarla a mantener las mamas humectadas y sanas:  Evite usar jabn en los pezones.    Use un sostn de soporte. Aunque no son esenciales, las camisetas sin mangas o los sostenes especiales para amamantar estn diseados para acceder fcilmente a las mamas, para amamantar sin tener que quitarse todo el sostn o la camiseta. Evite usar sostenes con aro o sostenes muy ajustados.  Seque al aire sus pezones durante 3 a 4minutos despus de amamantar al beb.  Utilice solo apsitos de algodn en el sostn para absorber las prdidas de leche. La prdida de un poco de leche materna entre las tomas es normal.  Utilice lanolina sobre los pezones luego de amamantar. La lanolina ayuda a mantener la humedad normal de la piel. Si usa lanolina pura, no tiene que lavarse los pezones antes de volver a alimentar al beb. La lanolina pura no es txica para el beb. Adems, puede extraer manualmente algunas gotas de leche materna y masajear suavemente esa leche sobre los pezones, para que la leche se seque al aire. Durante las primeras semanas despus de dar a luz, algunas mujeres pueden experimentar hinchazn en las mamas (congestin mamaria). La congestin puede hacer que sienta las mamas pesadas, calientes y sensibles al tacto. El pico de la congestin ocurre dentro de los 3 a 5 das despus del parto. Las siguientes recomendaciones pueden ayudarla a aliviar la congestin:  Vace por completo las mamas al amamantar o extraer leche. Puede aplicar calor hmedo en las mamas  (en la ducha o con toallas hmedas para manos) antes de amamantar o extraer leche. Esto aumenta la circulacin y ayuda a que la leche fluya. Si el beb no vaca por completo las mamas cuando lo amamanta, extraiga la leche restante despus de que haya finalizado.  Use un sostn ajustado (para amamantar o comn) o una camiseta sin mangas durante 1 o 2 das para indicar al cuerpo que disminuya ligeramente la produccin de leche.  Aplique compresas de hielo sobre las mamas, a menos que le resulte demasiado incmodo.  Asegrese de que el beb est prendido y se encuentre en la posicin correcta mientras lo alimenta. Si la congestin persiste luego de 48 horas o despus de seguir estas recomendaciones, comunquese con su mdico o un asesor en lactancia. RECOMENDACIONES GENERALES PARA EL CUIDADO DE LA SALUD DURANTE LA LACTANCIA MATERNA  Consuma alimentos saludables. Alterne comidas y colaciones, y coma 3 de cada una por da. Dado que lo que come afecta la leche materna, es posible que algunas comidas hagan que su beb se vuelva ms irritable de lo habitual. Evite comer este tipo de alimentos si percibe que afectan de manera negativa al beb.  Beba leche, jugos de fruta y agua para satisfacer su sed (aproximadamente 10 vasos al da).  Descanse con frecuencia, reljese y tome sus vitaminas prenatales para evitar la fatiga, el estrs y la anemia.  Contine con los autocontroles de la mama.  Evite masticar y fumar tabaco.  Evite el consumo de alcohol y drogas. Algunos medicamentos, que pueden ser perjudiciales para el beb, pueden pasar a travs de la leche materna. Es importante que consulte a su mdico antes de tomar cualquier medicamento, incluidos todos los medicamentos recetados y de venta libre, as como los suplementos vitamnicos y herbales. Puede quedar embarazada durante la lactancia. Si desea controlar la natalidad, consulte a su mdico cules son las opciones ms seguras para el  beb. SOLICITE ATENCIN MDICA SI:   Usted siente que quiere dejar de amamantar o se siente frustrada con la lactancia.  Siente dolor en las mamas o en los pezones.  Sus   pezones estn agrietados o sangran.  Sus pechos estn irritados, sensibles o calientes.  Tiene un rea hinchada en cualquiera de las mamas.  Siente escalofros o fiebre.  Tiene nuseas o vmitos.  Presenta una secrecin de otro lquido distinto de la leche materna de los pezones.  Sus mamas no se llenan antes de amamantar al beb para el quinto da despus del parto.  Se siente triste y deprimida.  El beb est demasiado somnoliento como para comer bien.  El beb tiene problemas para dormir.  Moja menos de 3 paales en 24 horas.  Defeca menos de 3 veces en 24 horas.  La piel del beb o la parte blanca de los ojos se vuelven amarillentas.  El beb no ha aumentado de peso a los 5 das de vida. SOLICITE ATENCIN MDICA DE INMEDIATO SI:   El beb est muy cansado (letargo) y no se quiere despertar para comer.  Le sube la fiebre sin causa. Document Released: 09/24/2005 Document Revised: 09/29/2013 [employer] Patient Information 2015 [employer]. This information is not intended to replace advice given to you by your health care provider. Make sure you discuss any questions you have with your health care provider.  

## 2014-09-29 NOTE — Progress Notes (Signed)
38 weeks, stable Labor precautions Cont PNV Okay to use yeast cream RTC 1 week

## 2014-09-29 NOTE — Progress Notes (Signed)
Edema- hands/feet   Pain-epigastric

## 2014-10-01 LAB — URINE CULTURE: Colony Count: 70000

## 2014-10-06 ENCOUNTER — Encounter (HOSPITAL_COMMUNITY): Payer: Self-pay | Admitting: *Deleted

## 2014-10-06 ENCOUNTER — Ambulatory Visit (INDEPENDENT_AMBULATORY_CARE_PROVIDER_SITE_OTHER): Payer: Self-pay | Admitting: Physician Assistant

## 2014-10-06 ENCOUNTER — Inpatient Hospital Stay (HOSPITAL_COMMUNITY): Payer: Medicaid Other | Admitting: Anesthesiology

## 2014-10-06 ENCOUNTER — Encounter (HOSPITAL_COMMUNITY)
Admission: AD | Disposition: A | Discharge status: Home / Self Care | Payer: Self-pay | Source: Ambulatory Visit | Attending: Obstetrics & Gynecology

## 2014-10-06 ENCOUNTER — Inpatient Hospital Stay (HOSPITAL_COMMUNITY)
Admission: AD | Admit: 2014-10-06 | Discharge: 2014-10-08 | DRG: 768 | Disposition: A | Discharge status: Home / Self Care | Payer: Medicaid Other | Source: Ambulatory Visit | Attending: Obstetrics & Gynecology | Admitting: Obstetrics & Gynecology

## 2014-10-06 VITALS — BP 112/67 | HR 79 | Temp 98.1°F | Wt 168.0 lb

## 2014-10-06 DIAGNOSIS — Z3A39 39 weeks gestation of pregnancy: Secondary | ICD-10-CM | POA: Diagnosis present

## 2014-10-06 DIAGNOSIS — Z3483 Encounter for supervision of other normal pregnancy, third trimester: Secondary | ICD-10-CM

## 2014-10-06 DIAGNOSIS — IMO0001 Reserved for inherently not codable concepts without codable children: Secondary | ICD-10-CM

## 2014-10-06 HISTORY — PX: DILATION AND CURETTAGE OF UTERUS: SHX78

## 2014-10-06 LAB — CBC
HCT: 35.3 % — ABNORMAL LOW (ref 36.0–46.0)
HEMATOCRIT: 35.6 % — AB (ref 36.0–46.0)
Hemoglobin: 12.2 g/dL (ref 12.0–15.0)
Hemoglobin: 12.3 g/dL (ref 12.0–15.0)
MCH: 32.4 pg (ref 26.0–34.0)
MCH: 32.4 pg (ref 26.0–34.0)
MCHC: 34.6 g/dL (ref 30.0–36.0)
MCHC: 34.6 g/dL (ref 30.0–36.0)
MCV: 93.9 fL (ref 78.0–100.0)
MCV: 93.7 fL (ref 78.0–100.0)
Platelets: 163 10*3/uL (ref 150–400)
Platelets: 144 10*3/uL — ABNORMAL LOW (ref 150–400)
RBC: 3.76 MIL/uL — ABNORMAL LOW (ref 3.87–5.11)
RBC: 3.8 MIL/uL — ABNORMAL LOW (ref 3.87–5.11)
RDW: 13.7 % (ref 11.5–15.5)
RDW: 13.6 % (ref 11.5–15.5)
WBC: 6.9 10*3/uL (ref 4.0–10.5)
WBC: 10.9 10*3/uL — ABNORMAL HIGH (ref 4.0–10.5)

## 2014-10-06 LAB — MRSA PCR SCREENING: MRSA by PCR: NEGATIVE

## 2014-10-06 LAB — DIC (DISSEMINATED INTRAVASCULAR COAGULATION)PANEL
D-Dimer, Quant: 3.88 ug/mL-FEU — ABNORMAL HIGH (ref 0.00–0.48)
Platelets: 159 10*3/uL (ref 150–400)
Prothrombin Time: 13.6 seconds (ref 11.6–15.2)
Smear Review: NONE SEEN

## 2014-10-06 LAB — POCT URINALYSIS DIP (DEVICE)
Bilirubin Urine: NEGATIVE
Glucose, UA: NEGATIVE mg/dL
HGB URINE DIPSTICK: NEGATIVE
KETONES UR: NEGATIVE mg/dL
Nitrite: NEGATIVE
PROTEIN: NEGATIVE mg/dL
SPECIFIC GRAVITY, URINE: 1.015 (ref 1.005–1.030)
Urobilinogen, UA: 0.2 mg/dL (ref 0.0–1.0)
pH: 6.5 (ref 5.0–8.0)

## 2014-10-06 LAB — HIV ANTIBODY (ROUTINE TESTING W REFLEX): HIV: NONREACTIVE

## 2014-10-06 LAB — DIC (DISSEMINATED INTRAVASCULAR COAGULATION) PANEL
FIBRINOGEN: 393 mg/dL (ref 204–475)
INR: 1.03 (ref 0.00–1.49)
aPTT: 29 seconds (ref 24–37)

## 2014-10-06 LAB — POSTPARTUM HEMORRHAGE PROTOCOL (BB NOTIFICATION)

## 2014-10-06 LAB — RPR

## 2014-10-06 SURGERY — DILATION AND CURETTAGE
Anesthesia: Spinal | Site: Vagina

## 2014-10-06 MED ORDER — ONDANSETRON HCL 4 MG PO TABS: 4.0000 mg | ORAL_TABLET | ORAL | Status: DC | PRN

## 2014-10-06 MED ORDER — BENZOCAINE-MENTHOL 20-0.5 % EX AERO: 1.0000 "application " | INHALATION_SPRAY | CUTANEOUS | Status: DC | PRN

## 2014-10-06 MED ORDER — ONDANSETRON HCL 4 MG/2ML IJ SOLN
INTRAMUSCULAR | Status: DC | PRN
  Administered 2014-10-06: 4 mg via INTRAVENOUS

## 2014-10-06 MED ORDER — DEXAMETHASONE SODIUM PHOSPHATE 4 MG/ML IJ SOLN
INTRAMUSCULAR | Status: AC
  Filled 2014-10-06: qty 1

## 2014-10-06 MED ORDER — CEFOXITIN SODIUM 2 G IV SOLR
2.0000 g | Freq: Once | INTRAVENOUS | Status: AC
  Administered 2014-10-06: 2 g via INTRAVENOUS
  Filled 2014-10-06: qty 2

## 2014-10-06 MED ORDER — OXYTOCIN BOLUS FROM INFUSION: 500.0000 mL | INTRAVENOUS | Status: DC

## 2014-10-06 MED ORDER — METHYLERGONOVINE MALEATE 0.2 MG/ML IJ SOLN: 0.2000 mg | Freq: Four times a day (QID) | INTRAMUSCULAR | Status: AC

## 2014-10-06 MED ORDER — LIDOCAINE HCL (PF) 1 % IJ SOLN
30.0000 mL | INTRAMUSCULAR | Status: AC | PRN
  Administered 2014-10-06: 30 mL via SUBCUTANEOUS
  Filled 2014-10-06 (×2): qty 30

## 2014-10-06 MED ORDER — BUPIVACAINE IN DEXTROSE 0.75-8.25 % IT SOLN
INTRATHECAL | Status: DC | PRN
  Administered 2014-10-06: 1.2 mL via INTRATHECAL

## 2014-10-06 MED ORDER — LACTATED RINGERS IV BOLUS (SEPSIS): 1000.0000 mL | Freq: Once | INTRAVENOUS | Status: DC

## 2014-10-06 MED ORDER — PROPOFOL 10 MG/ML IV EMUL
INTRAVENOUS | Status: AC
  Filled 2014-10-06: qty 20

## 2014-10-06 MED ORDER — MEPERIDINE HCL 25 MG/ML IJ SOLN: 6.2500 mg | INTRAMUSCULAR | Status: DC | PRN

## 2014-10-06 MED ORDER — WITCH HAZEL-GLYCERIN EX PADS: 1.0000 "application " | MEDICATED_PAD | CUTANEOUS | Status: DC | PRN

## 2014-10-06 MED ORDER — OXYCODONE-ACETAMINOPHEN 5-325 MG PO TABS
1.0000 | ORAL_TABLET | ORAL | Status: DC | PRN
  Administered 2014-10-07: 1 via ORAL
  Filled 2014-10-06: qty 1

## 2014-10-06 MED ORDER — MEASLES, MUMPS & RUBELLA VAC ~~LOC~~ INJ
0.5000 mL | INJECTION | Freq: Once | SUBCUTANEOUS | Status: DC
  Filled 2014-10-06: qty 0.5

## 2014-10-06 MED ORDER — SENNOSIDES-DOCUSATE SODIUM 8.6-50 MG PO TABS
2.0000 | ORAL_TABLET | ORAL | Status: DC
  Administered 2014-10-07: 2 via ORAL
  Filled 2014-10-06: qty 2

## 2014-10-06 MED ORDER — OXYCODONE-ACETAMINOPHEN 5-325 MG PO TABS: 1.0000 | ORAL_TABLET | ORAL | Status: DC | PRN

## 2014-10-06 MED ORDER — LIDOCAINE HCL 1 % IJ SOLN
INTRAMUSCULAR | Status: AC
  Filled 2014-10-06: qty 20

## 2014-10-06 MED ORDER — METHYLERGONOVINE MALEATE 0.2 MG PO TABS
0.2000 mg | ORAL_TABLET | Freq: Four times a day (QID) | ORAL | Status: AC
  Administered 2014-10-06 – 2014-10-07 (×4): 0.2 mg via ORAL
  Filled 2014-10-06 (×4): qty 1

## 2014-10-06 MED ORDER — FENTANYL CITRATE 0.05 MG/ML IJ SOLN
100.0000 ug | Freq: Once | INTRAMUSCULAR | Status: AC
  Administered 2014-10-06: 100 ug via INTRAVENOUS

## 2014-10-06 MED ORDER — ONDANSETRON HCL 4 MG/2ML IJ SOLN
INTRAMUSCULAR | Status: AC
  Filled 2014-10-06: qty 2

## 2014-10-06 MED ORDER — ONDANSETRON HCL 4 MG/2ML IJ SOLN
4.0000 mg | Freq: Four times a day (QID) | INTRAMUSCULAR | Status: DC | PRN
Start: 2014-10-06 — End: 2014-10-06

## 2014-10-06 MED ORDER — CARBOPROST TROMETHAMINE 250 MCG/ML IM SOLN: 250.0000 ug | INTRAMUSCULAR | Status: DC | PRN

## 2014-10-06 MED ORDER — IBUPROFEN 600 MG PO TABS
600.0000 mg | ORAL_TABLET | Freq: Four times a day (QID) | ORAL | Status: DC
  Administered 2014-10-06 – 2014-10-08 (×7): 600 mg via ORAL
  Filled 2014-10-06 (×7): qty 1

## 2014-10-06 MED ORDER — LIDOCAINE HCL (CARDIAC) 20 MG/ML IV SOLN
INTRAVENOUS | Status: AC
  Filled 2014-10-06: qty 5

## 2014-10-06 MED ORDER — LACTATED RINGERS IV SOLN: 500.0000 mL | INTRAVENOUS | Status: DC | PRN

## 2014-10-06 MED ORDER — LACTATED RINGERS IV SOLN
INTRAVENOUS | Status: DC | PRN
  Administered 2014-10-06 (×2): via INTRAVENOUS

## 2014-10-06 MED ORDER — METHYLERGONOVINE MALEATE 0.2 MG/ML IJ SOLN
0.2000 mg | Freq: Once | INTRAMUSCULAR | Status: AC
  Administered 2014-10-06: 0.2 mg via INTRAMUSCULAR

## 2014-10-06 MED ORDER — METHYLERGONOVINE MALEATE 0.2 MG/ML IJ SOLN
INTRAMUSCULAR | Status: AC
  Filled 2014-10-06: qty 1

## 2014-10-06 MED ORDER — OXYCODONE-ACETAMINOPHEN 5-325 MG PO TABS: 2.0000 | ORAL_TABLET | ORAL | Status: DC | PRN

## 2014-10-06 MED ORDER — FENTANYL CITRATE 0.05 MG/ML IJ SOLN
INTRAMUSCULAR | Status: AC
  Filled 2014-10-06: qty 2

## 2014-10-06 MED ORDER — CARBOPROST TROMETHAMINE 250 MCG/ML IM SOLN
INTRAMUSCULAR | Status: AC
  Administered 2014-10-06: 250 ug
  Filled 2014-10-06: qty 1

## 2014-10-06 MED ORDER — FENTANYL CITRATE 0.05 MG/ML IJ SOLN: 25.0000 ug | INTRAMUSCULAR | Status: DC | PRN

## 2014-10-06 MED ORDER — METHYLERGONOVINE MALEATE 0.2 MG PO TABS: 0.2000 mg | ORAL_TABLET | ORAL | Status: DC | PRN

## 2014-10-06 MED ORDER — PHENYLEPHRINE HCL 10 MG/ML IJ SOLN
INTRAMUSCULAR | Status: AC
  Filled 2014-10-06: qty 1

## 2014-10-06 MED ORDER — FENTANYL CITRATE 0.05 MG/ML IJ SOLN
INTRAMUSCULAR | Status: AC
Start: 2014-10-06 — End: 2014-10-06
  Administered 2014-10-06: 100 ug via INTRAVENOUS
  Filled 2014-10-06: qty 2

## 2014-10-06 MED ORDER — METHYLERGONOVINE MALEATE 0.2 MG/ML IJ SOLN
INTRAMUSCULAR | Status: AC
  Administered 2014-10-06: 0.2 mg
  Filled 2014-10-06: qty 1

## 2014-10-06 MED ORDER — MISOPROSTOL 200 MCG PO TABS
ORAL_TABLET | ORAL | Status: AC
  Filled 2014-10-06: qty 5

## 2014-10-06 MED ORDER — MIDAZOLAM HCL 2 MG/2ML IJ SOLN
INTRAMUSCULAR | Status: AC
  Filled 2014-10-06: qty 2

## 2014-10-06 MED ORDER — SIMETHICONE 80 MG PO CHEW: 80.0000 mg | CHEWABLE_TABLET | ORAL | Status: DC | PRN

## 2014-10-06 MED ORDER — LANOLIN HYDROUS EX OINT: TOPICAL_OINTMENT | CUTANEOUS | Status: DC | PRN

## 2014-10-06 MED ORDER — DIPHENHYDRAMINE HCL 25 MG PO CAPS: 25.0000 mg | ORAL_CAPSULE | Freq: Four times a day (QID) | ORAL | Status: DC | PRN

## 2014-10-06 MED ORDER — PRENATAL MULTIVITAMIN CH
1.0000 | ORAL_TABLET | Freq: Every day | ORAL | Status: DC
  Administered 2014-10-07 – 2014-10-08 (×2): 1 via ORAL
  Filled 2014-10-06 (×2): qty 1

## 2014-10-06 MED ORDER — METHYLERGONOVINE MALEATE 0.2 MG/ML IJ SOLN: 0.2000 mg | INTRAMUSCULAR | Status: DC | PRN

## 2014-10-06 MED ORDER — CITRIC ACID-SODIUM CITRATE 334-500 MG/5ML PO SOLN: 30.0000 mL | ORAL | Status: DC | PRN

## 2014-10-06 MED ORDER — ACETAMINOPHEN 325 MG PO TABS: 650.0000 mg | ORAL_TABLET | ORAL | Status: DC | PRN

## 2014-10-06 MED ORDER — LACTATED RINGERS IV SOLN
INTRAVENOUS | Status: DC
  Administered 2014-10-06: 125 mL/h via INTRAVENOUS

## 2014-10-06 MED ORDER — TETANUS-DIPHTH-ACELL PERTUSSIS 5-2.5-18.5 LF-MCG/0.5 IM SUSP
0.5000 mL | Freq: Once | INTRAMUSCULAR | Status: DC
  Filled 2014-10-06: qty 0.5

## 2014-10-06 MED ORDER — ONDANSETRON HCL 4 MG/2ML IJ SOLN: 4.0000 mg | INTRAMUSCULAR | Status: DC | PRN

## 2014-10-06 MED ORDER — METOCLOPRAMIDE HCL 5 MG/ML IJ SOLN: 10.0000 mg | Freq: Once | INTRAMUSCULAR | Status: DC | PRN

## 2014-10-06 MED ORDER — SODIUM CHLORIDE 0.9 % IR SOLN
Status: DC | PRN
  Administered 2014-10-06: 1000 mL

## 2014-10-06 MED ORDER — MISOPROSTOL 200 MCG PO TABS
1000.0000 ug | ORAL_TABLET | Freq: Once | ORAL | Status: AC
  Administered 2014-10-06: 1000 ug via RECTAL

## 2014-10-06 MED ORDER — FENTANYL CITRATE 0.05 MG/ML IJ SOLN
INTRAMUSCULAR | Status: AC
  Administered 2014-10-06: 100 ug
  Filled 2014-10-06: qty 2

## 2014-10-06 MED ORDER — DIBUCAINE 1 % RE OINT: 1.0000 "application " | TOPICAL_OINTMENT | RECTAL | Status: DC | PRN

## 2014-10-06 MED ORDER — OXYTOCIN 40 UNITS IN LACTATED RINGERS INFUSION - SIMPLE MED
62.5000 mL/h | INTRAVENOUS | Status: DC
  Administered 2014-10-06: 999 mL/h via INTRAVENOUS
  Administered 2014-10-06: 62.5 mL/h via INTRAVENOUS
  Filled 2014-10-06 (×2): qty 1000

## 2014-10-06 MED ORDER — DEXAMETHASONE SODIUM PHOSPHATE 4 MG/ML IJ SOLN
INTRAMUSCULAR | Status: DC | PRN
  Administered 2014-10-06: 4 mg via INTRAVENOUS

## 2014-10-06 SURGICAL SUPPLY — 19 items
BALLN POSTPARTUM SOS BAKRI (BALLOONS) ×3
BALLOON POSTPARTUM SOS BAKRI (BALLOONS) ×1 IMPLANT
CLOTH BEACON ORANGE TIMEOUT ST (SAFETY) ×3 IMPLANT
CONTAINER PREFILL 10% NBF 60ML (FORM) IMPLANT
GAUZE SPONGE 4X4 16PLY XRAY LF (GAUZE/BANDAGES/DRESSINGS) ×3 IMPLANT
GLOVE BIO SURGEON STRL SZ 6.5 (GLOVE) ×2 IMPLANT
GLOVE BIO SURGEON STRL SZ7 (GLOVE) ×3 IMPLANT
GLOVE BIO SURGEONS STRL SZ 6.5 (GLOVE) ×1
GLOVE BIOGEL PI IND STRL 7.0 (GLOVE) ×3 IMPLANT
GLOVE BIOGEL PI INDICATOR 7.0 (GLOVE) ×6
GOWN STRL REUS W/TWL LRG LVL3 (GOWN DISPOSABLE) ×9 IMPLANT
PACK VAGINAL MINOR WOMEN LF (CUSTOM PROCEDURE TRAY) ×3 IMPLANT
PAD OB MATERNITY 4.3X12.25 (PERSONAL CARE ITEMS) ×3 IMPLANT
PAD PREP 24X48 CUFFED NSTRL (MISCELLANEOUS) ×3 IMPLANT
SET BERKELEY SUCTION TUBING (SUCTIONS) ×3 IMPLANT
SUT VIC AB 2-0 CT1 27 (SUTURE) ×2
SUT VIC AB 2-0 CT1 TAPERPNT 27 (SUTURE) ×1 IMPLANT
TOWEL OR 17X24 6PK STRL BLUE (TOWEL DISPOSABLE) ×6 IMPLANT
VACURETTE 10 RIGID CVD (CANNULA) ×3 IMPLANT

## 2014-10-06 NOTE — Progress Notes (Signed)
I assisted Erin,RN with questions.Kipp Laurence. [name] Royal  Interpreter.

## 2014-10-06 NOTE — Progress Notes (Signed)
39 weeks, stable. Contraction have started since arrival at appt - 20-30 minutes apart, uncomfortable. Nearly constant pelvic pressure and back pain.   Denies LOF, vag bleeding, dysuria. Endorses good fetal movement. Last baby was born very quickly.   Will send to [facility] for labor eval, accompanied by nurse and interpreter.  Return in 1 week if no delivery.

## 2014-10-06 NOTE — Anesthesia Preprocedure Evaluation (Addendum)
Anesthesia Evaluation  Patient identified by MRN, date of birth, ID band Patient awake    Reviewed: Allergy & Precautions, H&P , NPO status , Patient's Chart, lab work & pertinent test results  Airway Mallampati: III  TM Distance: >3 FB Neck ROM: Full    Dental no notable dental hx. (+) Teeth Intact   Pulmonary neg pulmonary ROS,  breath sounds clear to auscultation  Pulmonary exam normal       Cardiovascular negative cardio ROS  Rhythm:Regular Rate:Normal     Neuro/Psych    GI/Hepatic negative GI ROS, Neg liver ROS,   Endo/Other  Obesity  Renal/GU negative Renal ROS     Musculoskeletal negative musculoskeletal ROS (+)   Abdominal (+) + obese,   Peds  Hematology  (+) Sickle cell trait and anemia ,   Anesthesia Other Findings   Reproductive/Obstetrics Post partum hemorrhage with possible retained POC Uterine atony                             Anesthesia Physical Anesthesia Plan  ASA: II and emergent  Anesthesia Plan: Spinal   Post-op Pain Management:    Induction:   Airway Management Planned: Natural Airway  Additional Equipment:   Intra-op Plan:   Post-operative Plan:   Informed Consent: I have reviewed the patients History and Physical, chart, labs and discussed the procedure including the risks, benefits and alternatives for the proposed anesthesia with the patient or authorized representative who has indicated his/her understanding and acceptance.   Dental advisory given  Plan Discussed with: Anesthesiologist, CRNA and Surgeon  Anesthesia Plan Comments:         Anesthesia Quick Evaluation

## 2014-10-06 NOTE — Anesthesia Procedure Notes (Signed)
Spinal Patient location during procedure: OR Start time: 10/06/2014 4:26 PM Staffing Anesthesiologist: Chaze Hruska A. Preanesthetic Checklist Completed: patient identified, site marked, surgical consent, pre-op evaluation, timeout performed, IV checked, risks and benefits discussed and monitors and equipment checked Spinal Block Patient position: sitting Prep: site prepped and draped and DuraPrep Patient monitoring: heart rate, cardiac monitor, continuous pulse ox and blood pressure Approach: midline Location: L3-4 Injection technique: single-shot Needle Needle type: Sprotte  Needle gauge: 24 G Needle length: 9 cm Needle insertion depth: 0 cm Assessment Sensory level: T8

## 2014-10-06 NOTE — Patient Instructions (Signed)
Lactancia materna (Breastfeeding) Decidir amamantar es una de las mejores elecciones que puede hacer por usted y su beb. El cambio hormonal durante el embarazo produce el desarrollo del tejido mamario y aumenta la cantidad y el tamao de los conductos galactforos. Estas hormonas tambin permiten que las protenas, los azcares y las grasas de la sangre produzcan la leche materna en las glndulas productoras de leche. Las hormonas impiden que la leche materna sea liberada antes del nacimiento del beb, adems de impulsar el flujo de leche luego del nacimiento. Una vez que ha comenzado a amamantar, pensar en el beb, as como la succin o el llanto, pueden estimular la liberacin de leche de las glndulas productoras de leche.  LOS BENEFICIOS DE AMAMANTAR Para el beb  La primera leche (calostro) ayuda a mejorar el funcionamiento del sistema digestivo del beb.  La leche tiene anticuerpos que ayudan a prevenir las infecciones en el beb.  El beb tiene una menor incidencia de asma, alergias y del sndrome de muerte sbita del lactante.  Los nutrientes en la leche materna son mejores para el beb que la leche maternizada y estn preparados exclusivamente para cubrir las necesidades del beb.  La leche materna mejora el desarrollo cerebral del beb.  Es menos probable que el beb desarrolle otras enfermedades, como obesidad infantil, asma o diabetes mellitus de tipo 2. Para usted   La lactancia materna favorece el desarrollo de un vnculo muy especial entre la madre y el beb.  Es conveniente. La leche materna siempre est disponible a la temperatura correcta y es econmica.  La lactancia materna ayuda a quemar caloras y a perder el peso ganado durante el embarazo.  Favorece la contraccin del tero al tamao que tena antes del embarazo de manera ms rpida y disminuye el sangrado (loquios) despus del parto.  La lactancia materna contribuye a reducir el riesgo de desarrollar diabetes  mellitus de tipo 2, osteoporosis o cncer de mama o de ovario en el futuro. SIGNOS DE QUE EL BEB EST HAMBRIENTO Primeros signos de hambre  Aumenta su estado de alerta o actividad.  Se estira.  Mueve la cabeza de un lado a otro.  Mueve la cabeza y abre la boca cuando se le toca la mejilla o la comisura de la boca (reflejo de bsqueda).  Aumenta las vocalizaciones, tales como sonidos de succin, se relame los labios, emite arrullos, suspiros, o chirridos.  Mueve la mano hacia la boca.  Se chupa con ganas los dedos o las manos. Signos tardos de hambre  Est agitado.  Llora de manera intermitente. Signos de hambre extrema Los signos de hambre extrema requerirn que lo calme y lo consuele antes de que el beb pueda alimentarse adecuadamente. No espere a que se manifiesten los siguientes signos de hambre extrema para comenzar a amamantar:   Agitacin.  Llanto intenso y fuerte.   Gritos. INFORMACIN BSICA SOBRE LA LACTANCIA MATERNA Iniciacin de la lactancia materna  Encuentre un lugar cmodo para sentarse o acostarse, con un buen respaldo para el cuello y la espalda.  Coloque una almohada o una manta enrollada debajo del beb para acomodarlo a la altura de la mama (si est sentada). Las almohadas para amamantar se han diseado especialmente a fin de servir de apoyo para los brazos y el beb mientras amamanta.  Asegrese de que el abdomen del beb est frente al suyo.  Masajee suavemente la mama. Con las yemas de los dedos, masajee la pared del pecho hacia el pezn en un movimiento circular.   Esto estimula el flujo de leche. Es posible que deba continuar este movimiento mientras amamanta si la leche fluye lentamente.  Sostenga la mama con el pulgar por arriba del pezn y los otros 4 dedos por debajo de la mama. Asegrese de que los dedos se encuentren lejos del pezn y de la boca del beb.  Empuje suavemente los labios del beb con el pezn o con el dedo.  Cuando la boca del  beb se abra lo suficiente, acrquelo rpidamente a la mama e introduzca todo el pezn y la zona oscura que lo rodea (areola), tanto como sea posible, dentro de la boca del beb.  Debe haber ms areola visible por arriba del labio superior del beb que por debajo del labio inferior.  La lengua del beb debe estar entre la enca inferior y la mama.  Asegrese de que la boca del beb est en la posicin correcta alrededor del pezn (prendida). Los labios del beb deben crear un sello sobre la mama y estar doblados hacia afuera (invertidos).  Es comn que el beb succione durante 2 a 3 minutos para que comience el flujo de leche materna. Cmo debe prenderse Es muy importante que le ensee al beb cmo prenderse adecuadamente a la mama. Si el beb no se prende adecuadamente, puede causarle dolor en el pezn y reducir la produccin de leche materna, y hacer que el beb tenga un escaso aumento de peso. Adems, si el beb no se prende adecuadamente al pezn, puede tragar aire durante la alimentacin. Esto puede causarle molestias al beb. Hacer eructar al beb al cambiar de mama puede ayudarlo a liberar el aire. Sin embargo, ensearle al beb cmo prenderse a la mama adecuadamente es la mejor manera de evitar que se sienta molesto por tragar aire mientras se alimenta. Signos de que el beb se ha prendido adecuadamente al pezn:   Tironea o succiona de modo silencioso, sin causarle dolor.  Se escucha que traga cada 3 o 4 succiones.   Hay movimientos musculares por arriba y por delante de sus odos al succionar. Signos de que el beb no se ha prendido adecuadamente al pezn:   Hace ruidos de succin o de chasquido mientras se alimenta.  Siente dolor en el pezn. Si cree que el beb no se prendi correctamente, deslice el dedo en la comisura de la boca y colquelo entre las encas del beb para interrumpir la succin. Intente comenzar a amamantar nuevamente. Signos de lactancia materna exitosa Signos  del beb:   Disminuye gradualmente el nmero de succiones o cesa la succin por completo.  Se duerme.  Relaja el cuerpo.  Retiene una pequea cantidad de leche en la boca.  Se desprende solo del pecho. Signos que presenta usted:  Las mamas han aumentado la firmeza, el peso y el tamao 1 a 3 horas despus de amamantar.  Estn ms blandas inmediatamente despus de amamantar.  Un aumento del volumen de leche, y tambin un cambio en su consistencia y color se producen hacia el quinto da de lactancia materna.  Los pezones no duelen, ni estn agrietados ni sangran. Signos de que su beb recibe la cantidad de leche suficiente  Moja al menos 3 paales en 24 horas. La orina debe ser clara y de color amarillo plido a los 5 das de vida.  Defeca al menos 3 veces en 24 horas a los 5 das de vida. La materia fecal debe ser blanda y amarillenta.  Defeca al menos 3 veces en 24 horas a   los 7 das de vida. La materia fecal debe ser grumosa y amarillenta.  No registra una prdida de peso mayor del 10% del peso al nacer durante los primeros 3 das de vida.  Aumenta de peso un promedio de 4 a 7onzas (113 a 198g) por semana despus de los 4 das de vida.  Aumenta de peso, diariamente, de manera uniforme a partir de los 5 das de vida, sin registrar prdida de peso despus de las 2semanas de vida. Despus de alimentarse, es posible que el beb regurgite una pequea cantidad. Esto es frecuente. FRECUENCIA Y DURACIN DE LA LACTANCIA MATERNA El amamantamiento frecuente la ayudar a producir ms leche y a prevenir problemas de dolor en los pezones e hinchazn en las mamas. Alimente al beb cuando muestre signos de hambre o si siente la necesidad de reducir la congestin de las mamas. Esto se denomina "lactancia a demanda". Evite el uso del chupete mientras trabaja para establecer la lactancia (las primeras 4 a 6 semanas despus del nacimiento del beb). Despus de este perodo, podr ofrecerle un  chupete. Las investigaciones demostraron que el uso del chupete durante el primer ao de vida del beb disminuye el riesgo de desarrollar el sndrome de muerte sbita del lactante (SMSL). Permita que el nio se alimente en cada mama todo lo que desee. Contine amamantando al beb hasta que haya terminado de alimentarse. Cuando el beb se desprende o se queda dormido mientras se est alimentando de la primera mama, ofrzcale la segunda. Debido a que, con frecuencia, los recin nacidos permanecen somnolientos las primeras semanas de vida, es posible que deba despertar al beb para alimentarlo. Los horarios de lactancia varan de un beb a otro. Sin embargo, las siguientes reglas pueden servir como gua para ayudarla a garantizar que el beb se alimenta adecuadamente:  Se puede amamantar a los recin nacidos (bebs de 4 semanas o menos de vida) cada 1 a 3 horas.  No deben transcurrir ms de 3 horas durante el da o 5 horas durante la noche sin que se amamante a los recin nacidos.  Debe amamantar al beb 8 veces como mnimo en un perodo de 24 horas, hasta que comience a introducir slidos en su dieta, a los 6 meses de vida aproximadamente. EXTRACCIN DE LECHE MATERNA La extraccin y el almacenamiento de la leche materna le permiten asegurarse de que el beb se alimente exclusivamente de leche materna, aun en momentos en los que no puede amamantar. Esto tiene especial importancia si debe regresar al trabajo en el perodo en que an est amamantando o si no puede estar presente en los momentos en que el beb debe alimentarse. Su asesor en lactancia puede orientarla sobre cunto tiempo es seguro almacenar leche materna.  El sacaleche es un aparato que le permite extraer leche de la mama a un recipiente estril. Luego, la leche materna extrada puede almacenarse en un refrigerador o congelador. Algunos sacaleches son manuales, mientras que otros son elctricos. Consulte a su asesor en lactancia qu tipo ser  ms conveniente para usted. Los sacaleches se pueden comprar; sin embargo, algunos hospitales y grupos de apoyo a la lactancia materna alquilan sacaleches mensualmente. Un asesor en lactancia puede ensearle cmo extraer leche materna manualmente, en caso de que prefiera no usar un sacaleche.  CMO CUIDAR LAS MAMAS DURANTE LA LACTANCIA MATERNA Los pezones se secan, agrietan y duelen durante la lactancia materna. Las siguientes recomendaciones pueden ayudarla a mantener las mamas humectadas y sanas:  Evite usar jabn en los pezones.    Use un sostn de soporte. Aunque no son esenciales, las camisetas sin mangas o los sostenes especiales para amamantar estn diseados para acceder fcilmente a las mamas, para amamantar sin tener que quitarse todo el sostn o la camiseta. Evite usar sostenes con aro o sostenes muy ajustados.  Seque al aire sus pezones durante 3 a 4minutos despus de amamantar al beb.  Utilice solo apsitos de algodn en el sostn para absorber las prdidas de leche. La prdida de un poco de leche materna entre las tomas es normal.  Utilice lanolina sobre los pezones luego de amamantar. La lanolina ayuda a mantener la humedad normal de la piel. Si usa lanolina pura, no tiene que lavarse los pezones antes de volver a alimentar al beb. La lanolina pura no es txica para el beb. Adems, puede extraer manualmente algunas gotas de leche materna y masajear suavemente esa leche sobre los pezones, para que la leche se seque al aire. Durante las primeras semanas despus de dar a luz, algunas mujeres pueden experimentar hinchazn en las mamas (congestin mamaria). La congestin puede hacer que sienta las mamas pesadas, calientes y sensibles al tacto. El pico de la congestin ocurre dentro de los 3 a 5 das despus del parto. Las siguientes recomendaciones pueden ayudarla a aliviar la congestin:  Vace por completo las mamas al amamantar o extraer leche. Puede aplicar calor hmedo en las mamas  (en la ducha o con toallas hmedas para manos) antes de amamantar o extraer leche. Esto aumenta la circulacin y ayuda a que la leche fluya. Si el beb no vaca por completo las mamas cuando lo amamanta, extraiga la leche restante despus de que haya finalizado.  Use un sostn ajustado (para amamantar o comn) o una camiseta sin mangas durante 1 o 2 das para indicar al cuerpo que disminuya ligeramente la produccin de leche.  Aplique compresas de hielo sobre las mamas, a menos que le resulte demasiado incmodo.  Asegrese de que el beb est prendido y se encuentre en la posicin correcta mientras lo alimenta. Si la congestin persiste luego de 48 horas o despus de seguir estas recomendaciones, comunquese con su mdico o un asesor en lactancia. RECOMENDACIONES GENERALES PARA EL CUIDADO DE LA SALUD DURANTE LA LACTANCIA MATERNA  Consuma alimentos saludables. Alterne comidas y colaciones, y coma 3 de cada una por da. Dado que lo que come afecta la leche materna, es posible que algunas comidas hagan que su beb se vuelva ms irritable de lo habitual. Evite comer este tipo de alimentos si percibe que afectan de manera negativa al beb.  Beba leche, jugos de fruta y agua para satisfacer su sed (aproximadamente 10 vasos al da).  Descanse con frecuencia, reljese y tome sus vitaminas prenatales para evitar la fatiga, el estrs y la anemia.  Contine con los autocontroles de la mama.  Evite masticar y fumar tabaco.  Evite el consumo de alcohol y drogas. Algunos medicamentos, que pueden ser perjudiciales para el beb, pueden pasar a travs de la leche materna. Es importante que consulte a su mdico antes de tomar cualquier medicamento, incluidos todos los medicamentos recetados y de venta libre, as como los suplementos vitamnicos y herbales. Puede quedar embarazada durante la lactancia. Si desea controlar la natalidad, consulte a su mdico cules son las opciones ms seguras para el  beb. SOLICITE ATENCIN MDICA SI:   Usted siente que quiere dejar de amamantar o se siente frustrada con la lactancia.  Siente dolor en las mamas o en los pezones.  Sus   pezones estn agrietados o sangran.  Sus pechos estn irritados, sensibles o calientes.  Tiene un rea hinchada en cualquiera de las mamas.  Siente escalofros o fiebre.  Tiene nuseas o vmitos.  Presenta una secrecin de otro lquido distinto de la leche materna de los pezones.  Sus mamas no se llenan antes de amamantar al beb para el quinto da despus del parto.  Se siente triste y deprimida.  El beb est demasiado somnoliento como para comer bien.  El beb tiene problemas para dormir.  Moja menos de 3 paales en 24 horas.  Defeca menos de 3 veces en 24 horas.  La piel del beb o la parte blanca de los ojos se vuelven amarillentas.  El beb no ha aumentado de peso a los 5 das de vida. SOLICITE ATENCIN MDICA DE INMEDIATO SI:   El beb est muy cansado (letargo) y no se quiere despertar para comer.  Le sube la fiebre sin causa. Document Released: 09/24/2005 Document Revised: 09/29/2013 [employer] Patient Information 2015 [employer]. This information is not intended to replace advice given to you by your health care provider. Make sure you discuss any questions you have with your health care provider.  

## 2014-10-06 NOTE — Progress Notes (Signed)
Interpreter at bedside for 2200 assessment. Discussed plan of care, baby and me book, recording baby's feedings and intake/output, use of bulb syringe, and answered all questions the pt had.

## 2014-10-06 NOTE — Anesthesia Postprocedure Evaluation (Signed)
  Anesthesia Post-op Note  Patient: Beth Russell  Procedure(s) Performed: Procedure(s): SUCTION DILATATION AND CURETTAGE / BAKRI BALLOON PLACEMENT / REPAIR OF VAGINAL LACERATION (N/A)  Patient Location: PACU  Anesthesia Type:Spinal  Level of Consciousness: awake, alert  and oriented  Airway and Oxygen Therapy: Patient Spontanous Breathing  Post-op Pain: none  Post-op Assessment: Post-op Vital signs reviewed, Patient's Cardiovascular Status Stable, Respiratory Function Stable, Patent Airway, No signs of Nausea or vomiting, Pain level controlled, No headache, No backache, No residual numbness and No residual motor weakness  Post-op Vital Signs: Reviewed and stable  Last Vitals:  Filed Vitals:   10/06/14 1815  BP: 119/94  Pulse: 71  Temp: 36.7 C  Resp: 31    Complications: No apparent anesthesia complications

## 2014-10-06 NOTE — Op Note (Signed)
Ericha Izora GalaCastro Rayon PROCEDURE DATE: 10/06/2014  PREOPERATIVE DIAGNOSIS: 28 yo G3P3 with pos partum hemorrhage  POSTOPERATIVE DIAGNOSIS: The same.  PROCEDURE:     Exam under anesthesia, D& C, cervical laceration repair, placement of Bakri Balloon  SURGEON:  Dr. Elsie LincolnKelly Hillari Zumwalt  ASSISTANT:  Dr. Burnetta SabinKristi Acosta  FINDINGS:  A 20 week uterus with minimal atony, 100 cc of clot in the uterus, small cervical laceration at 9 o'clock,   ANESTHESIA:    Spinal  ESTIMATED BLOOD LOSS:  150 cc total  SPECIMENS:  Endometrial curretings  COMPLICATIONS:  None immediate.  PROCEDURE DETAILS:  The patient received intravenous antibiotics while in the preoperative area.  She was then taken to the operating room where spinal anesthesia was administered and was found to be adequate.  After an adequate timeout was performed, she was placed in the dorsal lithotomy position and examined; then prepped and draped in the sterile manner.   A foley was in her bladder.   A vaginal speculum was then placed in the patient's vagina and the cervical lip was grasped with a ring forceps. A sharp curette was used to gently scrape the endometrium. Small amount of blood and decidua removed.  No obvious placenta nor membranes.  Bimanual also performed and no POC was noted.  Gentle suction curettage was performed with a 10mm curette.  The cervical laceration was repaired with 0 Vicryl.  A Bakri ballooon was placed and filled to 80cc.  The fundus was well contracted so the Joneen RoachBarki is in the LUS.  All instruments were removed from the patient's vagina. The patient tolerated the procedure well and was taken to the recovery area awake, and in stable condition.

## 2014-10-06 NOTE — Progress Notes (Signed)
Patient ID: Beth KiefDelia Castro Russell, female   DOB: 1986/05/03, 28 y.o.   MRN: [id number]   Called to evaluate vaginal bleeding.  Sterile bimanual exam eremoved 230cc (by weight) of clot and blood.  No retained products noted.  Uterus feels smooth contour.  Methergine ordered.  PPH stage 1 activated.  Eda Royal interpreting.

## 2014-10-06 NOTE — Progress Notes (Signed)
Patient ID: Beth KiefDelia Castro Russell, female   DOB: 01/15/1986, 28 y.o.   MRN: [id number]  Pt continues to have bleeding after methergine and cytotec.  Uterus explored again and small amount of clots removed.  Slow trickle continues.  Methergine and hemabated given. [facility] grad [facility] called.  [facility]  Interpreter present.  Patient was counseled regarding need for  D&C.  Risks of surgery including bleeding, infection, injury to surrounding organs, need for additional procedures, possibility of intrauterine scarring which may impair future fertility, and other postoperative/anesthesia complications were explained to patient.  Likelihood of success of complete evacuation of the uterus was discussed with the patient.  Written informed consent was obtained. Anesthesia and OR aware.  Preoperative prophylactic Doxycycline 100mg  IV  has been ordered and is on call to the OR.  To OR when ready.

## 2014-10-06 NOTE — H&P (Signed)
LABOR ADMISSION HISTORY AND PHYSICAL  Beth Russell is a 28 y.o. female G3P2002 with IUP at 7626w5d presenting for in active labor. She is unsure aboutan epidural for labor pain control. She plans on breast feeding. She request nexplanon for birth control.   Dating: By 7127w3d sono --->  Estimated Date of Delivery: 10/08/14   Prenatal History/Complications:  Past Medical History: Past Medical History  Diagnosis Date  . No pertinent past medical history   . History of sickle cell trait 08/25/2011  . Language barrier-spanish speaking only  08/25/2011    Past Surgical History: Past Surgical History  Procedure Laterality Date  . No past surgeries      Obstetrical History: OB History    Gravida Para Term Preterm AB TAB SAB Ectopic Multiple Living   3 2 2  0 0 0 0 0 0 2     Social History: History   Social History  . Marital Status: Single    Spouse Name: N/A    Number of Children: N/A  . Years of Education: N/A   Social History Main Topics  . Smoking status: Never Smoker   . Smokeless tobacco: Never Used  . Alcohol Use: No  . Drug Use: No  . Sexual Activity: Yes   Other Topics Concern  . None   Social History Narrative    Family History: History reviewed. No pertinent family history.  Allergies: No Known Allergies  Prescriptions prior to admission  Medication Sig Dispense Refill Last Dose  . Miconazole Nitrate (MONISTAT 7 COMBINATION PACK VA) Place vaginally.   Taking  . prenatal vitamin w/FE, FA (PRENATAL 1 + 1) 27-1 MG TABS tablet Take 1 tablet by mouth daily at 12 noon.   Taking     Review of Systems   All systems reviewed and negative except as stated in HPI  Blood pressure 114/60, pulse 81, temperature 98.4 F (36.9 C), temperature source Oral, resp. rate 18, height 5' 1.5" (1.562 m), weight 168 lb 9.6 oz (76.476 kg). General appearance: alert and cooperative Lungs: clear to auscultation bilaterally Heart: regular rate and rhythm Abdomen: soft,  non-tender; bowel sounds normal Pelvic: adequate Extremities: Homans sign is negative, no sign of DVT  Dilation: 10 Effacement (%): 100 Station: -2 Exam by:: felkelrn   Prenatal labs: ABO, Rh: O/POS/-- (08/26 1412) Antibody:   Rubella: 0.38 (06/23 1611) RPR: NON REAC (10/21 0925)  HBsAg: NEGATIVE (06/23 1611)  HIV: NONREACTIVE (10/21 0925)  GBS: Negative (12/09 0000)  1 hr 130 Genetic screening  Neg quad Anatomy US normal   [facility]  Dating By 1st trim U/S  Genetic Screen    Quad:  neg    NIPS:  Anatomic US  Normal anatomy, Posterior placenta, Cervix 3.5cm. Female  GTT Early:               Third trimester: 130  TDaP vaccine  07/28/14  Flu vaccine  06/30/14  GBS  Negative  Contraception  Nexplanon  Baby Food breast  Circumcision  NA  Pediatrician [facility]  Support Person  FOB       Results for orders placed or performed during the hospital encounter of 10/06/14 (from the past 24 hour(s))  CBC   Collection Time: 10/06/14 12:05 PM  Result Value Ref Range   WBC 6.9 4.0 - 10.5 K/uL   RBC 3.76 (L) 3.87 - 5.11 MIL/uL   Hemoglobin 12.2 12.0 - 15.0 g/dL   HCT 16.135.3 (L) 09.636.0 - 04.546.0 %  MCV 93.9 78.0 - 100.0 fL   MCH 32.4 26.0 - 34.0 pg   MCHC 34.6 30.0 - 36.0 g/dL   RDW 16.113.7 09.611.5 - 04.515.5 %   Platelets 163 150 - 400 K/uL  Results for orders placed or performed in visit on 10/06/14 (from the past 24 hour(s))  POCT urinalysis dip (device)   Collection Time: 10/06/14  8:46 AM  Result Value Ref Range   Glucose, UA NEGATIVE NEGATIVE mg/dL   Bilirubin Urine NEGATIVE NEGATIVE   Ketones, ur NEGATIVE NEGATIVE mg/dL   Specific Gravity, Urine 1.015 1.005 - 1.030   Hgb urine dipstick NEGATIVE NEGATIVE   pH 6.5 5.0 - 8.0   Protein, ur NEGATIVE NEGATIVE mg/dL   Urobilinogen, UA 0.2 0.0 - 1.0 mg/dL   Nitrite NEGATIVE NEGATIVE   Leukocytes, UA LARGE (A) NEGATIVE    Patient Active Problem List   Diagnosis Date Noted  . Active labor 10/06/2014  . Gestation  size to date discrepancy   . UTI (urinary tract infection) in pregnancy in third trimester 07/31/2014  . Constipation during pregnancy 07/28/2014  . Supervision of normal pregnancy 08/25/2011  . History of sickle cell trait 08/25/2011  . Language barrier-spanish speaking only  08/25/2011    Assessment: Beth Russell is a 28 y.o. G3P2002 at 4146w5d here for in active labor   #Labor:expectant mgmt #Pain: Epidural upon request #FWB: Cat I #ID:  GBS neg #MOF: breast #MOC:nexpl vs depo  Beth Russell 10/06/2014, 2:27 PM

## 2014-10-06 NOTE — Transfer of Care (Signed)
Immediate Anesthesia Transfer of Care Note  Patient: Beth LocketDelia Castro Russell  Procedure(s) Performed: Procedure(s): SUCTION DILATATION AND CURETTAGE / BAKRI BALLOON PLACEMENT / REPAIR OF VAGINAL LACERATION (N/A)  Patient Location: PACU  Anesthesia Type:Spinal  Level of Consciousness: awake, alert  and oriented  Airway & Oxygen Therapy: Patient Spontanous Breathing  Post-op Assessment: Report given to PACU RN and Post -op Vital signs reviewed and stable  Post vital signs: Reviewed and stable  Complications: No apparent anesthesia complications

## 2014-10-06 NOTE — Progress Notes (Signed)
Dr. Gentry RochJudd, anesthesiologist at bedside evaluating pt d/t bowel incontinence

## 2014-10-06 NOTE — MAU Note (Signed)
[facility] and [facility]. Sent to MAU for labor evaluation. States contractions are getting closer and stronger.

## 2014-10-06 NOTE — Progress Notes (Signed)
C/o of pelvic pressure. C/o of edema in hand and numbness in right hand.  Viviana SimplerAlis Herrera used as interpreter for this encounter.

## 2014-10-07 ENCOUNTER — Encounter (HOSPITAL_COMMUNITY): Payer: Self-pay | Admitting: Obstetrics & Gynecology

## 2014-10-07 LAB — CBC
HCT: 29.6 % — ABNORMAL LOW (ref 36.0–46.0)
HEMOGLOBIN: 10.4 g/dL — AB (ref 12.0–15.0)
MCH: 32.6 pg (ref 26.0–34.0)
MCHC: 35.1 g/dL (ref 30.0–36.0)
MCV: 92.8 fL (ref 78.0–100.0)
PLATELETS: 149 10*3/uL — AB (ref 150–400)
RBC: 3.19 MIL/uL — ABNORMAL LOW (ref 3.87–5.11)
RDW: 13.7 % (ref 11.5–15.5)
WBC: 14.7 10*3/uL — AB (ref 4.0–10.5)

## 2014-10-07 NOTE — Addendum Note (Signed)
Addendum  created 10/07/14 0841 by Algis GreenhouseLinda A Yarianna Varble, CRNA   Modules edited: Notes Section   Notes Section:  File: [id number]; File: [id number]

## 2014-10-07 NOTE — Progress Notes (Signed)
Post Partum Day 1 Subjective: Patient is doing well without complaints. She denies CP, SOB, lightheadedness/dizziness  Objective: Blood pressure 94/57, pulse 77, temperature 98.5 F (36.9 C), temperature source Oral, resp. rate 18, height 5' 1.5" (1.562 m), weight 168 lb 9.6 oz (76.476 kg), SpO2 99 %, unknown if currently breastfeeding.  Physical Exam:  General: alert, cooperative and no distress Lochia: appropriate, minmal output from Bakri ballon Uterine Fundus: firm DVT Evaluation: No evidence of DVT seen on physical exam. No cords or calf tenderness.   Recent Labs  10/06/14 1530 10/07/14 0538  HGB 12.3 10.4*  HCT 35.6* 29.6*    Assessment/Plan: Breastfeeding  Will discontinue Bakri balloon Patient may be transferred to the floor for routine postpartum care   LOS: 1 day   Samauri Kellenberger 10/07/2014, 9:17 AM

## 2014-10-07 NOTE — Progress Notes (Signed)
Stopped by to check on patient's needs and assisted Tamela OddiBetsy, Charity fundraiserN with questions. [name] Royal  Interpreter.

## 2014-10-07 NOTE — Progress Notes (Signed)
I assisted Engineering geologisttephanie RN with some questions , by Orlan LeavensViria Alvarez Interpreter

## 2014-10-07 NOTE — Anesthesia Postprocedure Evaluation (Addendum)
Anesthesia Post Note  Patient: Beth LocketDelia Castro Rayon  Procedure(s) Performed: Procedure(s) (LRB): SUCTION DILATATION AND CURETTAGE / BAKRI BALLOON PLACEMENT / REPAIR OF VAGINAL LACERATION (N/A)  Anesthesia type: Spinal  Patient location: adult ICU  Post pain: Pain level controlled  Post assessment: Post-op Vital signs reviewed  Last Vitals:  Filed Vitals:   10/07/14 0800  BP: 94/57  Pulse: 77  Temp: 36.9 C  Resp: 18    Post vital signs: Reviewed  Level of consciousness: awake  Complications: No apparent anesthesia complications

## 2014-10-07 NOTE — Progress Notes (Signed)
UR chart review completed.  

## 2014-10-07 NOTE — Lactation Note (Signed)
This note was copied from the chart of Beth Russell. Lactation Consultation Note  Interpreter Okey RegalCarol was present for this visit.  Mom reports that she has been putting the baby to the breast often but also giving formula related to her perception that she has little milk.  Her breasts feel like they are filling and hand expression was taught with colostrum easily expressible.   I encouraged mom to put the baby the breast often and to attempt to reduce the amount of formula given so that more stimulation will be done at breast by the baby and also to prevent engorgement.  I was going to set up a double pump but decided not too as her breasts were already filling.  A hand pump was given with instructions so that she could soften her breasts if necessary.  Follow up tomorrow.  Patient Name: Beth Russell ZOXWR'UToday's Date: 10/07/2014     Maternal Data    Feeding Feeding Type: Breast Fed Length of feed: 10 min  LATCH Score/Interventions Latch: Grasps breast easily, tongue down, lips flanged, rhythmical sucking.  Audible Swallowing: A few with stimulation Intervention(s): Skin to skin;Hand expression Intervention(s): Hand expression;Skin to skin;Alternate breast massage  Type of Nipple: Everted at rest and after stimulation  Comfort (Breast/Nipple): Filling, red/small blisters or bruises, mild/mod discomfort  Problem noted: Cracked, bleeding, blisters, bruises Interventions  (Cracked/bleeding/bruising/blister): Other (comment) (ebm)  Hold (Positioning): No assistance needed to correctly position infant at breast.  LATCH Score: 8  Lactation Tools Discussed/Used     Consult Status      Beth Russell, Beth Russell 10/07/2014, 6:01 PM

## 2014-10-08 LAB — URINE CULTURE
COLONY COUNT: NO GROWTH
ORGANISM ID, BACTERIA: NO GROWTH

## 2014-10-08 NOTE — Lactation Note (Signed)
This note was copied from the chart of Beth Bernece Izora Gala. Lactation Consultation Note: Follow up visit with Eda translating for me. Mom reports that baby has been nursing well with no pain. Experienced BF mom. Has been giving some bottles of formula. Encouraged to always BF first to soften breasts. Then give formula if baby is still hungry. No questions at present.,To call prn  Patient Name: Beth Russell ZOXWR'U Date: 10/08/2014 Reason for consult: Follow-up assessment   Maternal Data Formula Feeding for Exclusion: No Does the patient have breastfeeding experience prior to this delivery?: Yes  Feeding   LATCH Score/Interventions                      Lactation Tools Discussed/Used     Consult Status Consult Status: Complete    Pamelia Hoit 10/08/2014, 8:16 AM

## 2014-10-08 NOTE — Discharge Instructions (Signed)

## 2014-10-08 NOTE — Progress Notes (Signed)
I assisted Stephanie RN with some questions , by Viria Alvarez Interpreter °

## 2014-10-08 NOTE — Discharge Summary (Signed)
Obstetric Discharge Summary Reason for Admission: onset of labor Prenatal Procedures: none Intrapartum Procedures: spontaneous vaginal delivery Postpartum Procedures: D&C, Bakri balloon placement, cervical laceration repair Complications-Operative and Postpartum: 1st degree perineal laceration  Delivery Note At 2:05 PM a viable female was delivered via Vaginal, Spontaneous Delivery (Presentation: Right Occiput Anterior).  APGAR: 8, 9; weight 7 lb 12.5 oz (3530 g).   Placenta status: Intact, Spontaneous.  Cord: 3 vessels with the following complications: None.    Anesthesia: None  Episiotomy: None Lacerations: 1st degree Suture Repair: 3.0 vicryl rapide Est. Blood Loss (mL): 200  Mom to postpartum.  Baby to Couplet care / Skin to Skin.  Ziva Nunziata ROCIO 10/08/2014, 9:06 AM     Hospital Course:  Active Problems:   Active labor   Isola Izora Gala is a 29 y.o. [phone] s/p SVD postpartum hemorrhage with placement of Bakri balloon with 80cc after D&C and cervical lac repair.  Patient presented to OBT in active labor and was admitted to L&D.  She has postpartum course that was uncomplicated including no problems with ambulating, PO intake, urination, pain, or bleeding. The pt feels ready to go home and  will be discharged with outpatient follow-up.   Today: No acute events overnight.  Currently has headache, has not eaten since yesterday, very hungry this morning.  Lochia Small.  Plan for birth control is  Depo-Provera.  Method of Feeding: breast   H/H: Lab Results  Component Value Date/Time   HGB 10.4* 10/07/2014 05:38 AM   HCT 29.6* 10/07/2014 05:38 AM    Discharge Diagnoses: Term Pregnancy-delivered and postpartum hemorrhage  Discharge Information: Date: 10/08/2014 Activity: pelvic rest Diet: routine  Medications: None Breast feeding:  Yes Condition: stable Instructions: refer to handout Discharge to: home       Discharge Instructions    Call MD for:   redness, tenderness, or signs of infection (pain, swelling, redness, odor or green/yellow discharge around incision site)    Complete by:  As directed      Call MD for:  severe uncontrolled pain    Complete by:  As directed      Call MD for:  temperature >100.4    Complete by:  As directed      Diet - low sodium heart healthy    Complete by:  As directed             Medication List    STOP taking these medications        MONISTAT 7 COMBINATION PACK VA      TAKE these medications        prenatal vitamin w/FE, FA 27-1 MG Tabs tablet  Take 1 tablet by mouth daily at 12 noon.       Follow-up Information    Follow up with [facility] [place] In 5 weeks.   Contact information:   [address] [place] [address] [phone]      Perry Mount ,MD OB Fellow 10/08/2014,9:10 AM

## 2014-10-08 NOTE — Progress Notes (Addendum)
Patient in bed, appears flushed. Reports approximately 0030 she was standing, changing baby, when headache (8/10), dizziness, and facial numbness began. Able to finish infant care and sit in bed holding infant.  Denies visual changes or weakness/numbess elsewhere.  Spinal block insertion site without drainage or bruising. Blood pressure stable. Called and spoke to Owens Shark, RN, who answered phone for Dr. Loreta Ave. Provider instructions to give oral fluids to patient.  Patient then informed me she has had only water all day due to misunderstanding AICU RN instructions.  Food provided to patient. Dr. Loreta Ave informed.     Joni Fears Eveleigh Crumpler RN 10/08/14 0100

## 2014-10-08 NOTE — Progress Notes (Addendum)
Patient reports symptoms and headache resolving with eating and drinking. Patient attributes symptoms to lack of sleep and food.   Joni Fears Indonesia Mckeough RN 10/08/14 0200

## 2014-10-08 NOTE — Progress Notes (Signed)
I assisted [name], RN with discharge instructions. [name] Royal  Interpreter.

## 2014-10-09 LAB — TYPE AND SCREEN
ABO/RH(D): O POS
ANTIBODY SCREEN: NEGATIVE
UNIT DIVISION: 0
Unit division: 0
Unit division: 0
Unit division: 0

## 2014-11-18 ENCOUNTER — Ambulatory Visit: Payer: Self-pay | Admitting: Medical

## 2015-11-28 IMAGING — US US OB DETAIL+14 WK
1 series · 12 of 28 positions shown · non-contrast
Comparison: none

[Series 1: us ob comp +14 wk · 12 of 97 slices shown]
[im 4/97]
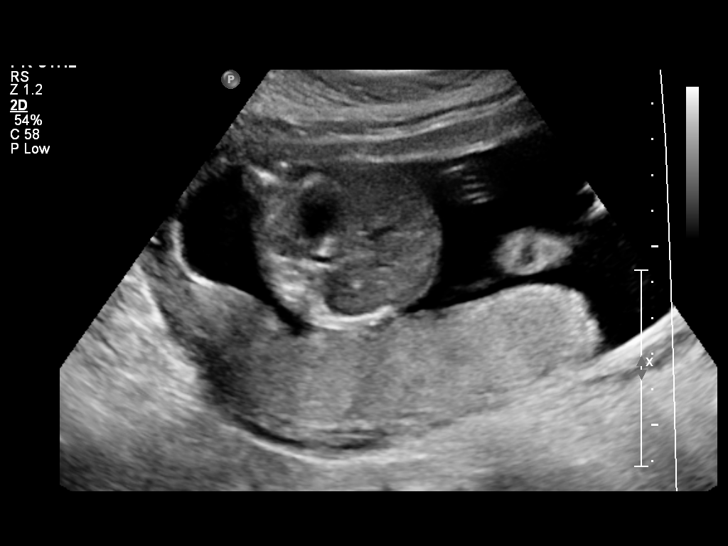
[im 11/97]
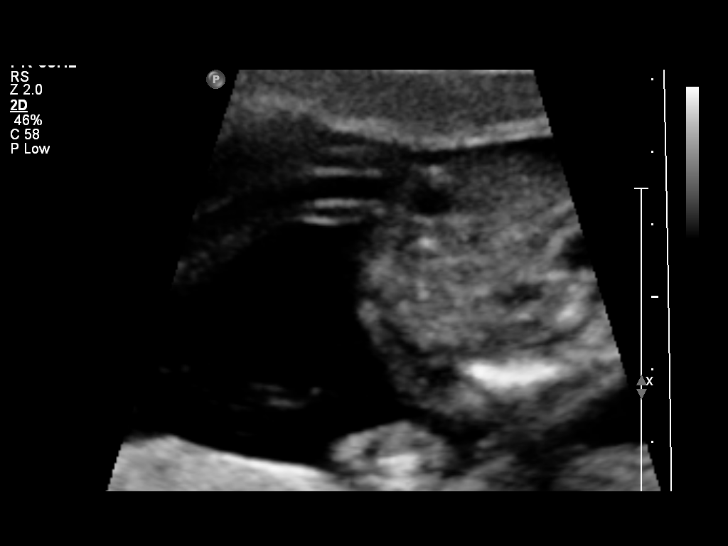
[im 18/97]
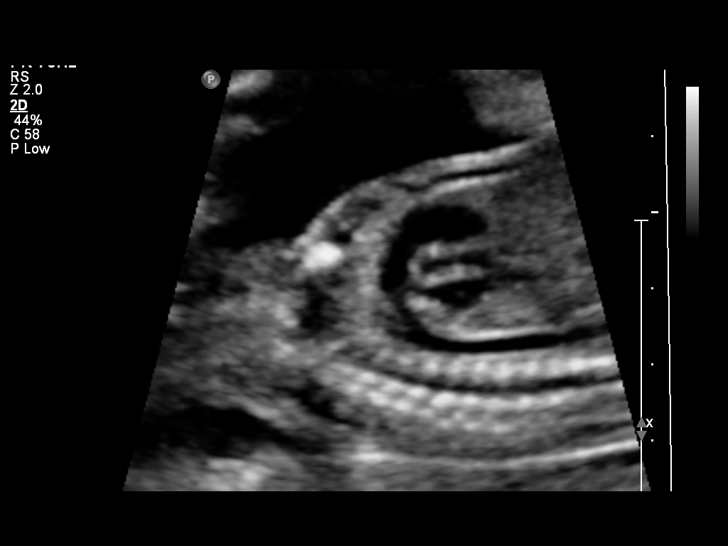
[im 29/97]
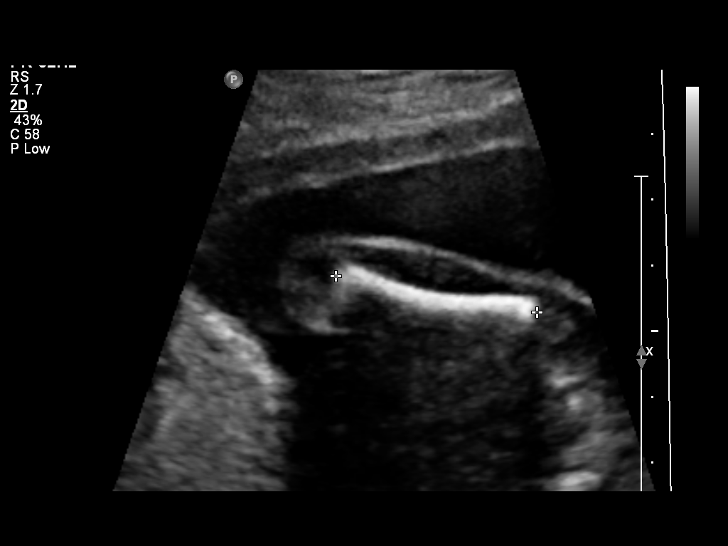
[im 36/97]
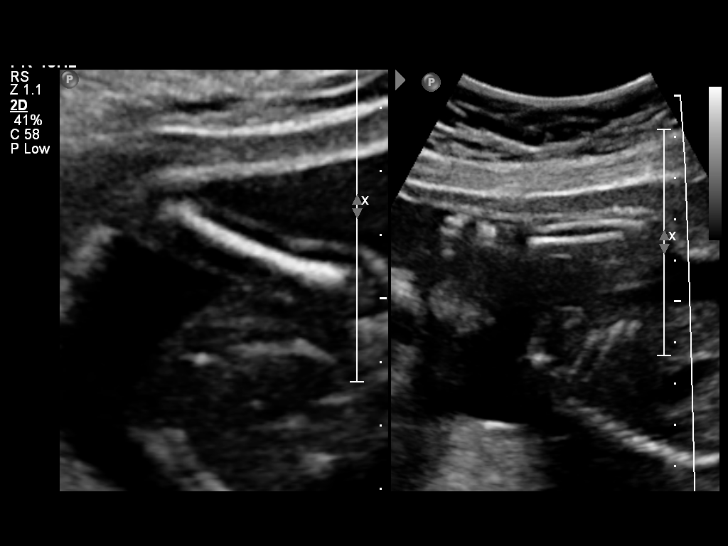
[im 43/97]
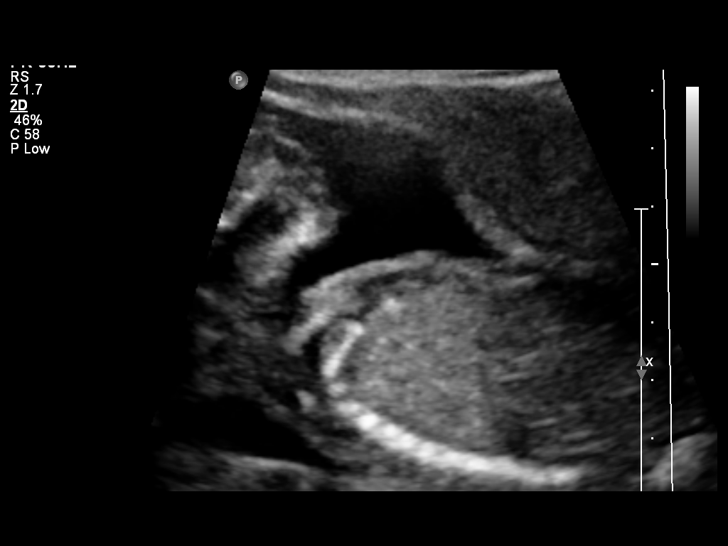
[im 54/97]
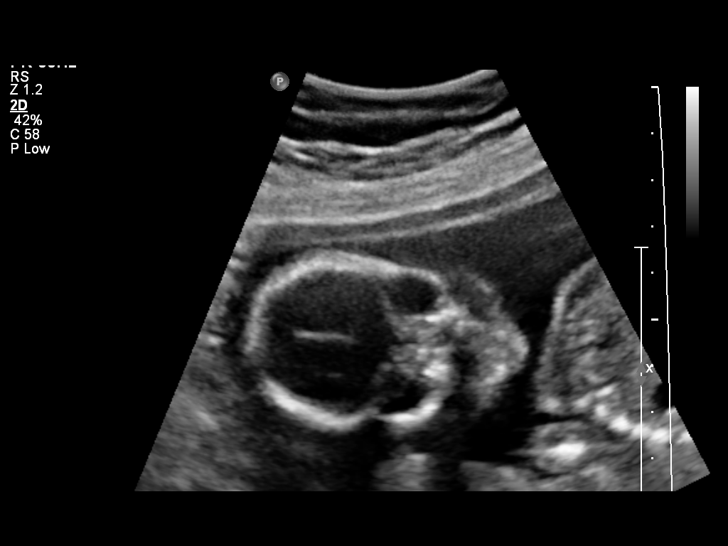
[im 61/97]
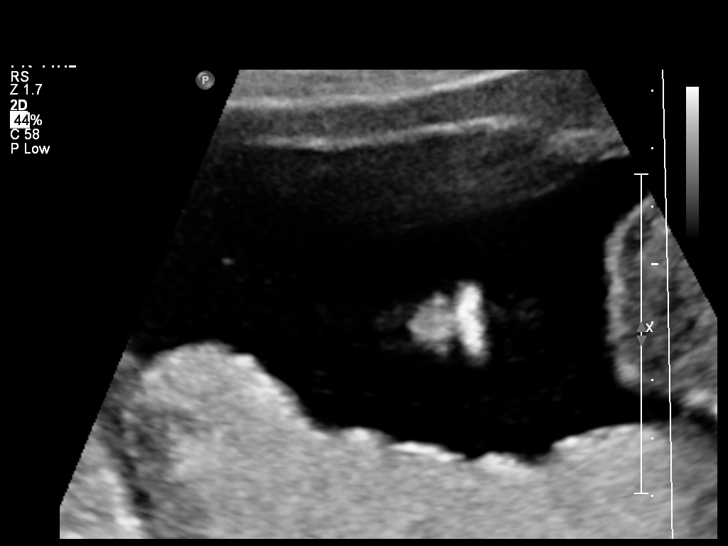
[im 68/97]
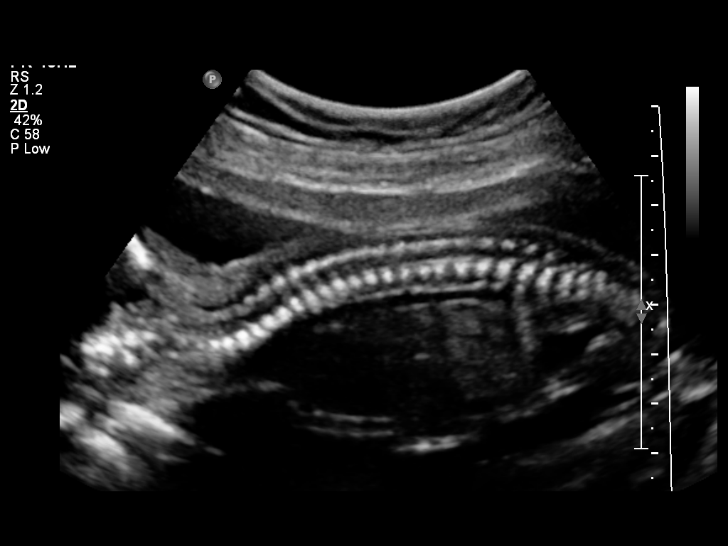
[im 79/97]
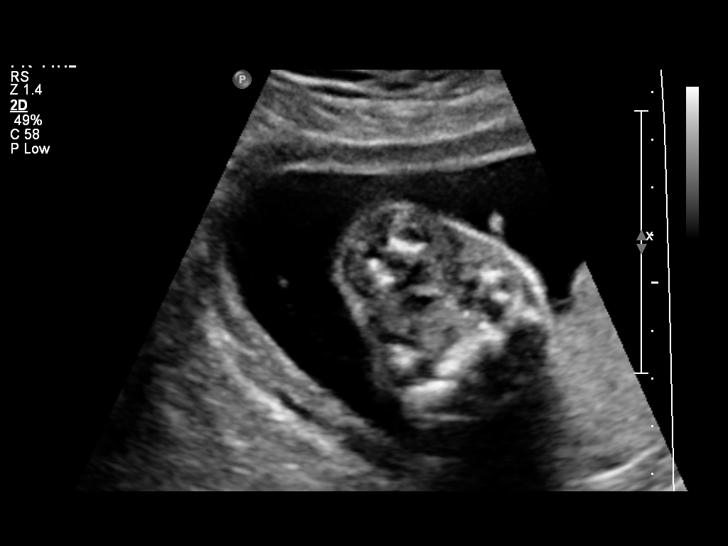
[im 86/97]
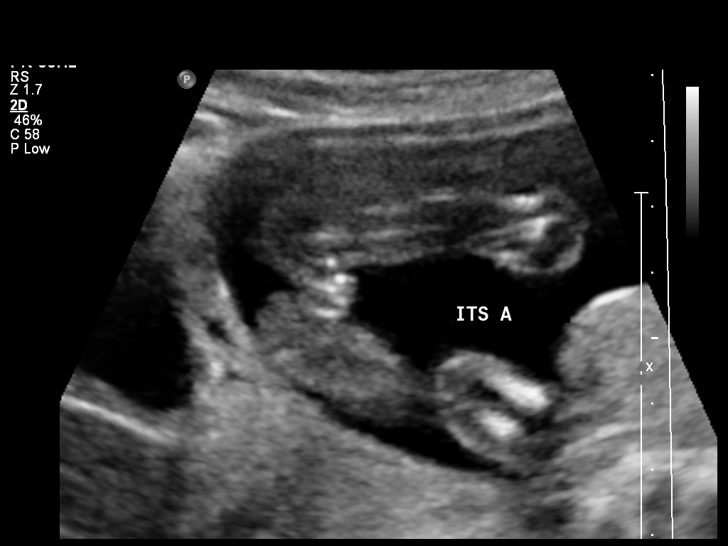
[im 93/97]
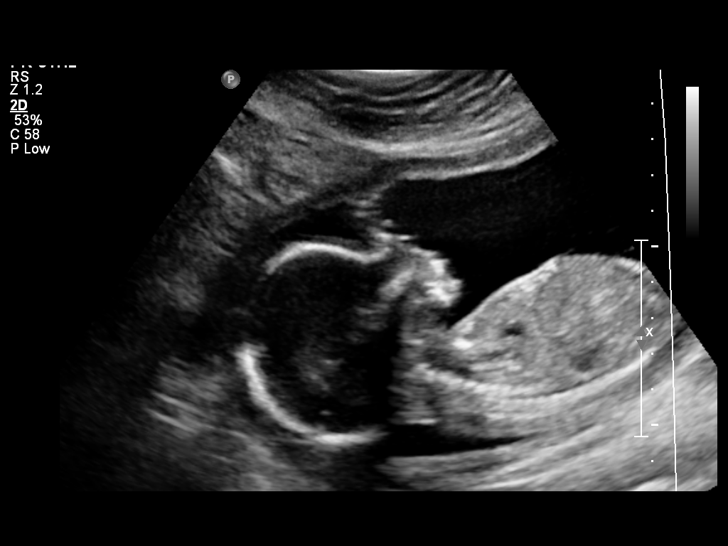

[12 of 28 positions shown; findings below may reference images not displayed]

OBSTETRICS REPORT
                      (Signed Final 05/17/2014 [DATE])

Service(s) Provided

 US OB DETAIL + 14 WK                                  76811.0
Indications

 Detailed fetal anatomic survey
 Sickle cell disease
Fetal Evaluation

 Num Of Fetuses:    1
 Cardiac Activity:  Observed
 Presentation:      Breech
 Placenta:          Posterior, above cervical
                    os
 AFI Sum:     4.92    cm      < 3  %Tile     Larg Pckt:    4.92  cm
 RUQ:   4.92    cm
Biometry

 BPD:       45  mm     G. Age:  19w 4d                CI:        72.72   70 - 86
                                                      FL/HC:      18.4   16.1 -

 HC:     167.8  mm     G. Age:  19w 3d       43  %    HC/AC:      1.13   1.09 -

 AC:     148.7  mm     G. Age:  20w 1d       68  %    FL/BPD:
 FL:      30.9  mm     G. Age:  19w 4d       48  %    FL/AC:      20.8   20 - 24
 HUM:     29.5  mm     G. Age:  19w 5d       57  %
 CER:     19.2  mm     G. Age:  18w 4d       28  %
 NFT:     4.49  mm

 Est. FW:     315  gm    0 lb 11 oz      53  %
Gestational Age

 LMP:           19w 3d        Date:  01/01/14                 EDD:   10/08/14
 U/S Today:     19w 5d                                        EDD:   10/06/14
 Best:          19w 3d     Det. By:  LMP  (01/01/14)          EDD:   10/08/14
Anatomy

 Cranium:          Appears normal         Aortic Arch:      Appears normal
 Fetal Cavum:      Appears normal         Ductal Arch:      Appears normal
 Ventricles:       Appears normal         Diaphragm:        Appears normal
 Choroid Plexus:   Appears normal         Stomach:          Appears normal, left
                                                            sided
 Cerebellum:       Appears normal         Abdomen:          Appears normal
 Posterior Fossa:  Appears normal         Abdominal Wall:   Appears nml (cord
                                                            insert, abd wall)
 Nuchal Fold:      Appears normal         Cord Vessels:     Appears normal (3
                                                            vessel cord)
 Face:             Appears normal         Kidneys:          Appear normal
                   (orbits and profile)
 Lips:             Appears normal         Bladder:          Appears normal
 Heart:            Appears normal         Spine:            Appears normal
                   (4CH, axis, and
                   situs)
 RVOT:             Appears normal         Lower             Appears normal
                                          Extremities:
 LVOT:             Appears normal         Upper             Appears normal
                                          Extremities:

 Other:  Female gender. Heels and 5th digit visualized. Nasal bone visualized.
Targeted Anatomy

 Fetal Central Nervous System
 Lat. Ventricles:  4.6                    Cisterna Magna:
Cervix Uterus Adnexa

 Cervical Length:    3.53     cm

 Cervix:       Normal appearance by transabdominal scan.
 Left Ovary:    No adnexal mass visualized.
 Right Ovary:   Size(cm) L: 1.92 x W: 1.77 x H: 1.26  Volume(cc):
Impression

 Single IUP at 19w 3d
 Normal fetal anatomic survey
 No markers associated with aneuploidy noted
 Posterior placenta without previa
 Normal amniotic fluid volume
Recommendations

 Follow-up ultrasounds as clinically indicated.

## 2016-07-29 ENCOUNTER — Emergency Department (HOSPITAL_COMMUNITY): Payer: Self-pay

## 2016-07-29 ENCOUNTER — Encounter (HOSPITAL_COMMUNITY): Payer: Self-pay | Admitting: *Deleted

## 2016-07-29 ENCOUNTER — Emergency Department (HOSPITAL_COMMUNITY)
Admission: EM | Admit: 2016-07-29 | Discharge: 2016-07-29 | Disposition: A | Discharge status: Home / Self Care | Payer: Self-pay | Attending: Emergency Medicine | Admitting: Emergency Medicine

## 2016-07-29 DIAGNOSIS — B9789 Other viral agents as the cause of diseases classified elsewhere: Secondary | ICD-10-CM

## 2016-07-29 DIAGNOSIS — J069 Acute upper respiratory infection, unspecified: Secondary | ICD-10-CM | POA: Insufficient documentation

## 2016-07-29 DIAGNOSIS — R0981 Nasal congestion: Secondary | ICD-10-CM

## 2016-07-29 NOTE — ED Triage Notes (Signed)
Per interpreter: pt c/o cold symptoms for a month. Mild nausea with some back pain.

## 2016-07-29 NOTE — ED Provider Notes (Signed)
[facility]PT Provider Note   CSN: [id number] Arrival date & time: 07/29/16  1547  By signing my name below, I, Beth Russell, attest that this documentation has been prepared under the direction and in the presence of Beth MarlinJessica Kemaya Dorner, PA-C.  Electronically Signed: [name], ED Scribe. 07/29/16. 5:32 PM.  History   Chief Complaint Chief Complaint  Patient presents with  . Cough  . URI   The history is provided by the patient. A language interpreter was used ([place]).   HPI Comments: Beth Russell is a 30 y.o. female with a h/o seasonal allergies, who presents to the Emergency Department complaining of persistent, dry cough over the past ~1 month. She reports associated sore throat, chills, nausea, post-tussive emesis, post-nasal drip, sinus pressure, congestion, and rhinorrhea, and improving bilateral ear pain secondary to the onset of her cough. Pt additionally notes that she sore-like and burning chest and upper back pain secondary to her cough only. Pt has been taking Mucinex with minimal relief of her symptoms. She notes that her cough is moderately worse during the night. No sick contacts with similar symptoms. No h/o asthma or DM. She denies fevers, abdominal pain, visual changes, SOB, hearing loss, or any other associated symptoms.   Past Medical History:  Diagnosis Date  . History of sickle cell trait 08/25/2011  . Language barrier-spanish speaking only  08/25/2011  . No pertinent past medical history    Patient Active Problem List   Diagnosis Date Noted  . Active labor 10/06/2014  . Gestation size to date discrepancy   . UTI (urinary tract infection) in pregnancy in third trimester 07/31/2014  . Constipation during pregnancy 07/28/2014  . Supervision of normal pregnancy 08/25/2011  . History of sickle cell trait 08/25/2011  . Language barrier-spanish speaking only  08/25/2011   Past Surgical History:  Procedure Laterality Date  . DILATION  AND CURETTAGE OF UTERUS N/A 10/06/2014   Procedure: SUCTION DILATATION AND CURETTAGE / BAKRI BALLOON PLACEMENT / REPAIR OF VAGINAL LACERATION;  Surgeon: Lesly DukesKelly H Leggett, MD;  Location: [facility];  Service: Gynecology;  Laterality: N/A;  . NO PAST SURGERIES     OB History    Gravida Para Term Preterm AB Living   3 3 3  0 0 3   SAB TAB Ectopic Multiple Live Births   0 0 0 0 3     Home Medications    Prior to Admission medications   Medication Sig Start Date End Date Taking? Authorizing Provider  prenatal vitamin w/FE, FA (PRENATAL 1 + 1) 27-1 MG TABS tablet Take 1 tablet by mouth daily at 12 noon.    Historical Provider, MD   Family History History reviewed. No pertinent family history.  Social History Social History  Substance Use Topics  . Smoking status: Never Smoker  . Smokeless tobacco: Never Used  . Alcohol use No   Allergies   Review of patient's allergies indicates no known allergies.  Review of Systems Review of Systems  Constitutional: Positive for chills. Negative for fever.  HENT: Positive for congestion, ear pain (bilateral), postnasal drip, rhinorrhea, sinus pressure and sore throat. Negative for hearing loss.   Eyes: Negative for visual disturbance.  Respiratory: Positive for cough. Negative for shortness of breath.   Cardiovascular: Positive for chest pain.  Gastrointestinal: Positive for nausea and vomiting (post-tussive). Negative for abdominal pain.  Musculoskeletal: Positive for back pain and neck pain (right-sided).  All other systems reviewed and are negative.  Physical Exam Updated Vital  Signs BP 99/78 (BP Location: Right Leg)   Pulse 95   Temp 98.3 F (36.8 C) (Oral)   Resp 20   LMP 07/10/2016   SpO2 100%   Physical Exam  Constitutional: She appears well-developed and well-nourished. No distress.  HENT:  Head: Normocephalic and atraumatic.  Right Ear: External ear normal. Tympanic membrane is retracted. Tympanic membrane is not erythematous and  not bulging.  Left Ear: External ear normal. Tympanic membrane is retracted. Tympanic membrane is not erythematous and not bulging.  Nose: Mucosal edema and rhinorrhea present. Right sinus exhibits no maxillary sinus tenderness and no frontal sinus tenderness. Left sinus exhibits no maxillary sinus tenderness and no frontal sinus tenderness.  Mouth/Throat: Uvula is midline and mucous membranes are normal. No oral lesions. No trismus in the jaw. No uvula swelling. Posterior oropharyngeal erythema present. No oropharyngeal exudate, posterior oropharyngeal edema or tonsillar abscesses. No tonsillar exudate.  Eyes: Conjunctivae are normal.  Cardiovascular: Normal rate, regular rhythm and normal heart sounds.   No murmur heard. Pulmonary/Chest: Effort normal and breath sounds normal. No respiratory distress. She has no wheezes. She has no rales. She exhibits no tenderness.  Musculoskeletal: Normal range of motion.  Lymphadenopathy:    She has no cervical adenopathy.  Neurological: She is alert. Coordination normal.  Skin: Skin is warm and dry. She is not diaphoretic.  Psychiatric: She has a normal mood and affect. Her behavior is normal.  Nursing note and vitals reviewed.  ED Treatments / Results  DIAGNOSTIC STUDIES: Oxygen Saturation is 100% on RA, normal by my interpretation.   COORDINATION OF CARE: 5:32 PM-Discussed next steps with pt. Pt verbalized understanding and is agreeable with the plan.   Radiology Dg Chest 2 View  Result Date: 07/29/2016 CLINICAL DATA:  Upper chest pain, back pain, cough EXAM: CHEST  2 VIEW COMPARISON:  None. FINDINGS: The heart size and mediastinal contours are within normal limits. Both lungs are clear. The visualized skeletal structures are unremarkable. IMPRESSION: No active cardiopulmonary disease. Electronically Signed   By: Natasha Mead M.D.   On: 07/29/2016 16:39   Procedures Procedures   Medications Ordered in ED Medications - No data to  display  Initial Impression / Assessment and Plan / ED Course  I have reviewed the triage vital signs and the nursing notes.  Pertinent labs & imaging results that were available during my care of the patient were reviewed by me and considered in my medical decision making (see chart for details).  Clinical Course   Pt is a 30yo female presents with likely URI. On exam, pt in NAD. VSS. Afebrile. Lungs CTA, Heart RRR. ]Pt CXR negative for acute infiltrate. Patients symptoms are consistent with URI, likely viral etiology. Discussed that antibiotics are not indicated for viral infections. Pt will be discharged with symptomatic treatment. Pt is hemodynamically stable & in NAD prior to dc. Pt is comfortable with above plan and is stable for discharge at this time. All questions were answered prior to disposition and instructed to f/u with PCP in 4 days if symptoms are not improving. Strict return precautions discussed.   Final Clinical Impressions(s) / ED Diagnoses   Final diagnoses:  Viral URI with cough  Sinus congestion   New Prescriptions New Prescriptions   No medications on file   I personally performed the services described in this documentation, which was scribed in my presence. The recorded information has been reviewed and is accurate.       Jerre Simon, PA 07/29/16  1756    Marily Memos, MD 07/29/16 [phone]

## 2016-07-29 NOTE — Discharge Instructions (Signed)
Take Mucinex DM and Zyrtec  Return to the emergency department if you experience fever, worsening sore throat, swelling of your throat, difficulty swallowing, difficulty breathing, or any other concerning symptoms.

## 2017-05-09 NOTE — Telephone Encounter (Signed)
See note

## 2017-07-12 ENCOUNTER — Emergency Department (HOSPITAL_COMMUNITY): Payer: Self-pay

## 2017-07-12 ENCOUNTER — Inpatient Hospital Stay (HOSPITAL_COMMUNITY)
Admission: EM | Admit: 2017-07-12 | Discharge: 2017-07-13 | Disposition: A | Discharge status: Other Acute Inpatient Hospital | Payer: Self-pay | Attending: Obstetrics and Gynecology | Admitting: Obstetrics and Gynecology

## 2017-07-12 ENCOUNTER — Encounter (HOSPITAL_COMMUNITY): Payer: Self-pay | Admitting: Emergency Medicine

## 2017-07-12 DIAGNOSIS — D649 Anemia, unspecified: Secondary | ICD-10-CM | POA: Insufficient documentation

## 2017-07-12 DIAGNOSIS — R102 Pelvic and perineal pain: Secondary | ICD-10-CM | POA: Insufficient documentation

## 2017-07-12 DIAGNOSIS — O039 Complete or unspecified spontaneous abortion without complication: Secondary | ICD-10-CM | POA: Insufficient documentation

## 2017-07-12 DIAGNOSIS — O4690 Antepartum hemorrhage, unspecified, unspecified trimester: Secondary | ICD-10-CM

## 2017-07-12 DIAGNOSIS — Z3A11 11 weeks gestation of pregnancy: Secondary | ICD-10-CM | POA: Insufficient documentation

## 2017-07-12 DIAGNOSIS — O209 Hemorrhage in early pregnancy, unspecified: Secondary | ICD-10-CM

## 2017-07-12 LAB — COMPREHENSIVE METABOLIC PANEL
ALK PHOS: 50 U/L (ref 38–126)
ALT: 15 U/L (ref 14–54)
ANION GAP: 9 (ref 5–15)
AST: 21 U/L (ref 15–41)
Albumin: 3.8 g/dL (ref 3.5–5.0)
BILIRUBIN TOTAL: 0.5 mg/dL (ref 0.3–1.2)
BUN: 9 mg/dL (ref 6–20)
CALCIUM: 8.9 mg/dL (ref 8.9–10.3)
CO2: 24 mmol/L (ref 22–32)
Chloride: 104 mmol/L (ref 101–111)
Creatinine, Ser: 0.57 mg/dL (ref 0.44–1.00)
GLUCOSE: 98 mg/dL (ref 65–99)
POTASSIUM: 3.5 mmol/L (ref 3.5–5.1)
Sodium: 137 mmol/L (ref 135–145)
Total Protein: 6.8 g/dL (ref 6.5–8.1)

## 2017-07-12 LAB — URINALYSIS, ROUTINE W REFLEX MICROSCOPIC
Bilirubin Urine: NEGATIVE
GLUCOSE, UA: NEGATIVE mg/dL
Ketones, ur: NEGATIVE mg/dL
Nitrite: NEGATIVE
PH: 6 (ref 5.0–8.0)
Protein, ur: NEGATIVE mg/dL
SPECIFIC GRAVITY, URINE: 1.015 (ref 1.005–1.030)

## 2017-07-12 LAB — CBC
HCT: 34.7 % — ABNORMAL LOW (ref 36.0–46.0)
Hemoglobin: 11.9 g/dL — ABNORMAL LOW (ref 12.0–15.0)
MCH: 30.8 pg (ref 26.0–34.0)
MCHC: 34.3 g/dL (ref 30.0–36.0)
MCV: 89.9 fL (ref 78.0–100.0)
PLATELETS: 217 10*3/uL (ref 150–400)
RBC: 3.86 MIL/uL — ABNORMAL LOW (ref 3.87–5.11)
RDW: 12.9 % (ref 11.5–15.5)
WBC: 7.5 10*3/uL (ref 4.0–10.5)

## 2017-07-12 LAB — LIPASE, BLOOD: Lipase: 36 U/L (ref 11–51)

## 2017-07-12 LAB — HCG, QUANTITATIVE, PREGNANCY: hCG, Beta Chain, Quant, S: 2779 m[IU]/mL — ABNORMAL HIGH (ref <5)

## 2017-07-12 NOTE — ED Triage Notes (Signed)
Reports being [redacted] weeks pregnant.  C/o lower abdominal pain and vaginal bleeding that started today.

## 2017-07-12 NOTE — ED Provider Notes (Signed)
[facility] Provider Note   CSN: [id number] Arrival date & time: 07/12/17  1850     History   Chief Complaint Chief Complaint  Patient presents with  . Abdominal Pain    HPI Beth Russell is a 31 y.o. female presents to the ED for evaluation of sudden onset of "contractions" like pain to bilateral lower abdomen associated with vaginal bleeding and spotting starting today. She is going through multiple pads an hour. She had a pregnancy test and she was told she is probably around [redacted] weeks pregnant. Denies falls or trauma. She is not on any oral contraceptives. Last menstrual period 04/22/17. Has previous history of 3 pregnancies and 3 live births, all vaginal deliveries. Reports complication of "hemorrhage" with last delivery. Does not know details.   HPI  Past Medical History:  Diagnosis Date  . History of sickle cell trait 08/25/2011  . Language barrier-spanish speaking only  08/25/2011  . No pertinent past medical history     Patient Active Problem List   Diagnosis Date Noted  . Active labor 10/06/2014  . Gestation size to date discrepancy   . UTI (urinary tract infection) in pregnancy in third trimester 07/31/2014  . Constipation during pregnancy 07/28/2014  . Supervision of normal pregnancy 08/25/2011  . History of sickle cell trait 08/25/2011  . Language barrier-spanish speaking only  08/25/2011    Past Surgical History:  Procedure Laterality Date  . DILATION AND CURETTAGE OF UTERUS N/A 10/06/2014   Procedure: SUCTION DILATATION AND CURETTAGE / BAKRI BALLOON PLACEMENT / REPAIR OF VAGINAL LACERATION;  Surgeon: Lesly Dukes, MD;  Location: [facility];  Service: Gynecology;  Laterality: N/A;  . NO PAST SURGERIES      OB History    Gravida Para Term Preterm AB Living   0 0 3   SAB TAB Ectopic Multiple Live Births   0 0 0 0 3       Home Medications    Prior to Admission medications   Medication Sig Start Date End Date Taking? Authorizing  Provider  prenatal vitamin w/FE, FA (PRENATAL 1 + 1) 27-1 MG TABS tablet Take 1 tablet by mouth daily at 12 noon.    [provider]    Family History No family history on file.  Social History Social History  Substance Use Topics  . Smoking status: Never Smoker  . Smokeless tobacco: Never Used  . Alcohol use No     Allergies   Patient has no known allergies.   Review of Systems Review of Systems  Constitutional: Negative for chills and fever.  Gastrointestinal: Positive for abdominal pain, nausea and vomiting. Negative for blood in stool and constipation.  Genitourinary: Positive for pelvic pain and vaginal bleeding. Negative for difficulty urinating, dysuria, hematuria, vaginal discharge and vaginal pain.  Musculoskeletal: Negative for back pain.  Neurological: Negative for dizziness and light-headedness.     Physical Exam Updated Vital Signs BP (!) 92/54   Pulse 78   Temp 98.3 F (36.8 C) (Oral)   Resp 19   SpO2 100%   Physical Exam  Constitutional: She is oriented to person, place, and time. She appears well-developed and well-nourished. No distress.  HENT:  Head: Normocephalic and atraumatic.  Nose: Nose normal.  Mouth/Throat: Oropharynx is clear and moist. No oropharyngeal exudate.  Eyes: Pupils are equal, round, and reactive to light. Conjunctivae and EOM are normal.  Neck: Normal range of motion. Neck supple. No JVD present.  Cardiovascular: Normal rate,  regular rhythm, normal heart sounds and intact distal pulses.   No murmur heard. Pulmonary/Chest: Effort normal and breath sounds normal. No respiratory distress. She has no wheezes. She has no rales. She exhibits no tenderness.  Abdominal: Soft. Bowel sounds are normal. She exhibits no distension and no mass. There is tenderness. There is no rebound and no guarding.  Mild RLQ and LLQ abdominal tenderness No suprapubic or CVA tenderness  Genitourinary:  Genitourinary Comments: Moderate amount of  blood in vaginal vault and intraotus Clots and pink tissue removed from vaginal vault Unable to visualize cervix given amount of blood Bimanual exam not performed given discomfort, pt was emotionally upset Chaperone present during exam External genitalia normal without erythema, edema, tenderness,  lesions.  No groin lymphadenopathy.   Musculoskeletal: Normal range of motion. She exhibits no deformity.  Lymphadenopathy:    She has no cervical adenopathy.  Neurological: She is alert and oriented to person, place, and time. No sensory deficit.  Skin: Skin is warm and dry. Capillary refill takes less than 2 seconds.  Psychiatric: She has a normal mood and affect. Her behavior is normal. Judgment and thought content normal.  Nursing note and vitals reviewed.    ED Treatments / Results  Labs (all labs ordered are listed, but only abnormal results are displayed) Labs Reviewed  WET PREP, GENITAL - Abnormal; Notable for the following:       Result Value   WBC, Wet Prep HPF POC FEW (*)    All other components within normal limits  CBC - Abnormal; Notable for the following:    RBC 3.86 (*)    Hemoglobin 11.9 (*)    HCT 34.7 (*)    All other components within normal limits  URINALYSIS, ROUTINE W REFLEX MICROSCOPIC - Abnormal; Notable for the following:    APPearance HAZY (*)    Hgb urine dipstick LARGE (*)    Leukocytes, UA TRACE (*)    Bacteria, UA FEW (*)    Squamous Epithelial / LPF 0-5 (*)    All other components within normal limits  HCG, QUANTITATIVE, PREGNANCY - Abnormal; Notable for the following:    hCG, Beta Chain, Quant, S 2,779 (*)    All other components within normal limits  LIPASE, BLOOD  COMPREHENSIVE METABOLIC PANEL  HEMOGLOBIN AND HEMATOCRIT, BLOOD  ABO/RH  TYPE AND SCREEN  PREPARE RBC (CROSSMATCH)  GC/CHLAMYDIA PROBE AMP ([place]) NOT AT [facility]    EKG  EKG Interpretation None       Radiology US Ob Comp < 14 Wks  Result Date: 07/13/2017 CLINICAL  DATA:  Abdominal pain and vaginal bleeding today. Estimated gestational age by LMP is 11 weeks 4 days. Quantitative beta HCG is 2,779 EXAM: OBSTETRIC <14 WK [place] AND TRANSVAGINAL OB US TECHNIQUE: Both transabdominal and transvaginal ultrasound examinations were performed for complete evaluation of the gestation as well as the maternal uterus, adnexal regions, and pelvic cul-de-sac. Transvaginal technique was performed to assess early pregnancy. COMPARISON:  None. FINDINGS: Intrauterine gestational sac: No intrauterine gestational sac is identified. Yolk sac:  Not Visualized. Embryo:  Not Visualized. Cardiac Activity: Not Visualized. Maternal uterus/adnexae: The uterus is anteverted. No myometrial mass lesions are identified. There is diffuse heterogeneous expansion of the endometrium with endometrial thickness measuring 2.6 cm. Endometrial heterogeneous echogenic material expands into the lower uterine segment, endocervical region, and likely into the vagina with lower uterine segment measuring 6.8 cm in thickness. Flow is demonstrated within the endometrial contents on color flow Doppler imaging. Right ovary  is visualized and measures 2.5 x 2 x 1.9 cm. Corpus luteal cyst is demonstrated. No abnormal adnexal masses. Left ovary is not visualized. No free fluid in the pelvis. IMPRESSION: 1. No intrauterine pregnancy is demonstrated. 2. Extensive heterogeneous endometrial thickening with flow in the endometrial contents. Changes consistent with spontaneous abortion in progress. Likely retained products of conception within the endometrium. 3. Since no definitive intrauterine pregnancy is identified, ectopic pregnancy is not entirely excluded but felt to be unlikely in this setting. 4. Further management should be based on the clinical evaluation. If clinically indicated, a follow up examination 6-8 weeks may be useful or earlier follow-up if bleeding continues. Electronically Signed   By: Burman Nieves M.D.   On:  07/13/2017 00:34   US Ob Transvaginal  Result Date: 07/13/2017 CLINICAL DATA:  Abdominal pain and vaginal bleeding today. Estimated gestational age by LMP is 11 weeks 4 days. Quantitative beta HCG is 2,779 EXAM: OBSTETRIC <14 WK [place] AND TRANSVAGINAL OB US TECHNIQUE: Both transabdominal and transvaginal ultrasound examinations were performed for complete evaluation of the gestation as well as the maternal uterus, adnexal regions, and pelvic cul-de-sac. Transvaginal technique was performed to assess early pregnancy. COMPARISON:  None. FINDINGS: Intrauterine gestational sac: No intrauterine gestational sac is identified. Yolk sac:  Not Visualized. Embryo:  Not Visualized. Cardiac Activity: Not Visualized. Maternal uterus/adnexae: The uterus is anteverted. No myometrial mass lesions are identified. There is diffuse heterogeneous expansion of the endometrium with endometrial thickness measuring 2.6 cm. Endometrial heterogeneous echogenic material expands into the lower uterine segment, endocervical region, and likely into the vagina with lower uterine segment measuring 6.8 cm in thickness. Flow is demonstrated within the endometrial contents on color flow Doppler imaging. Right ovary is visualized and measures 2.5 x 2 x 1.9 cm. Corpus luteal cyst is demonstrated. No abnormal adnexal masses. Left ovary is not visualized. No free fluid in the pelvis. IMPRESSION: 1. No intrauterine pregnancy is demonstrated. 2. Extensive heterogeneous endometrial thickening with flow in the endometrial contents. Changes consistent with spontaneous abortion in progress. Likely retained products of conception within the endometrium. 3. Since no definitive intrauterine pregnancy is identified, ectopic pregnancy is not entirely excluded but felt to be unlikely in this setting. 4. Further management should be based on the clinical evaluation. If clinically indicated, a follow up examination 6-8 weeks may be useful or earlier follow-up if  bleeding continues. Electronically Signed   By: Burman Nieves M.D.   On: 07/13/2017 00:34    Procedures Procedures (including critical care time)  Medications Ordered in ED Medications  methylergonovine (METHERGINE) injection 0.2 mg (not administered)  oxytocin (PITOCIN) IV infusion 40 units in LR 1000 mL - Premix (not administered)  0.9 %  sodium chloride infusion (not administered)  misoprostol (CYTOTEC) tablet 800 mcg (not administered)  sodium chloride 0.9 % bolus 1,000 mL (1,000 mLs Intravenous New Bag/Given 07/13/17 0110)     Initial Impression / Assessment and Plan / ED Course  I have reviewed the triage vital signs and the nursing notes.  Pertinent labs & imaging results that were available during my care of the patient were reviewed by me and considered in my medical decision making (see chart for details).  Clinical Course as of Jul 13 126  Sat Jul 13, 2017  0025 Ultrasound tech contacted me, states ultrasound had to be stopped because of large amount of bleeding. Pt is upset, screaming and yelling at her husband. She is scared she is dying because she has never seen  this much blood come out of her. I was able to calm pt down with breathing techniques. Pending ABO/Rh, will trend H/H and get IVF  [CG]  0100 RN notified me pt was unresponsive. On  my exam pt is pale, but A&O x 4. She has large clots coming out of vagina. Dr. Preston Fleeting at bedside. Will start pitocin.  [CG]  0122 IMPRESSION: 1. No intrauterine pregnancy is demonstrated. 2. Extensive heterogeneous endometrial thickening with flow in the endometrial contents. Changes consistent with spontaneous abortion in progress. Likely retained products of conception within the endometrium. 3. Since no definitive intrauterine pregnancy is identified, ectopic pregnancy is not entirely excluded but felt to be unlikely in this setting. 4. Further management should be based on the clinical evaluation. If clinically indicated, a follow  up examination 6-8 weeks may be useful or earlier follow-up if bleeding continues. US OB Transvaginal [CG]    Clinical Course User Index [CG] Liberty Handy, PA-C   Ultrasound showso intrauterine pregnancy. Changes consistent with spontaneous abortion in progress with retained products of conception and endometrium. Ectopic pregnancy is not entirely excluded.  Vital signs have remained stable, systolic blood pressure in the low 100s. No tachycardia. She is mentating well. She does look pale from initial exam. Spoke to Dr. Emelda Fear who will accept patient. We'll get type and screen, crossmatch, start 1 L IV fluids, Pitocin and follow-up H/H.   Dr. Emelda Fear called back, explains there [facility], he recommends 800 microg misoprostol PO. Given. Pt will be handed off to EDP Preston Fleeting who will monitor pt until transport. I discussed lab work, ultrasound findings and plan to trasnport to [facility] with patient via interpreter. Husband and pt agreeable. Discussed plan with RN.   Final Clinical Impressions(s) / ED Diagnoses   Final diagnoses:  Spontaneous abortion    New Prescriptions New Prescriptions   No medications on file     Jerrell Mylar 07/13/17 0127    Cathren Laine, MD 07/13/17 [phone]

## 2017-07-12 NOTE — ED Notes (Signed)
Pt in ultrasound at this time

## 2017-07-13 ENCOUNTER — Encounter (HOSPITAL_COMMUNITY): Payer: Self-pay | Admitting: *Deleted

## 2017-07-13 DIAGNOSIS — O039 Complete or unspecified spontaneous abortion without complication: Secondary | ICD-10-CM

## 2017-07-13 LAB — CBC
HCT: 26.8 % — ABNORMAL LOW (ref 36.0–46.0)
HEMOGLOBIN: 9.5 g/dL — AB (ref 12.0–15.0)
MCH: 31.8 pg (ref 26.0–34.0)
MCHC: 35.4 g/dL (ref 30.0–36.0)
MCV: 89.6 fL (ref 78.0–100.0)
PLATELETS: 186 10*3/uL (ref 150–400)
RBC: 2.99 MIL/uL — AB (ref 3.87–5.11)
RDW: 13 % (ref 11.5–15.5)
WBC: 11.1 10*3/uL — AB (ref 4.0–10.5)

## 2017-07-13 LAB — HEMOGLOBIN AND HEMATOCRIT, BLOOD
HCT: 32.1 % — ABNORMAL LOW (ref 36.0–46.0)
HEMOGLOBIN: 11.4 g/dL — AB (ref 12.0–15.0)

## 2017-07-13 LAB — ABO/RH: ABO/RH(D): O POS

## 2017-07-13 LAB — WET PREP, GENITAL
CLUE CELLS WET PREP: NONE SEEN
Sperm: NONE SEEN
Trich, Wet Prep: NONE SEEN
Yeast Wet Prep HPF POC: NONE SEEN

## 2017-07-13 LAB — PREPARE RBC (CROSSMATCH)

## 2017-07-13 MED ORDER — OXYTOCIN 40 UNITS IN LACTATED RINGERS INFUSION - SIMPLE MED
10.0000 [IU]/h | INTRAVENOUS | Status: DC
  Administered 2017-07-13: 10 [IU]/h via INTRAVENOUS
  Filled 2017-07-13: qty 1000

## 2017-07-13 MED ORDER — HYDROCODONE-ACETAMINOPHEN 5-325 MG PO TABS
1.0000 | ORAL_TABLET | Freq: Once | ORAL | Status: AC
  Administered 2017-07-13: 1 via ORAL
  Filled 2017-07-13: qty 1

## 2017-07-13 MED ORDER — SODIUM CHLORIDE 0.9 % IV BOLUS (SEPSIS)
1000.0000 mL | Freq: Once | INTRAVENOUS | Status: AC
  Administered 2017-07-13: 1000 mL via INTRAVENOUS

## 2017-07-13 MED ORDER — MISOPROSTOL 200 MCG PO TABS
800.0000 ug | ORAL_TABLET | Freq: Once | ORAL | Status: AC
  Administered 2017-07-13: 800 ug via ORAL
  Filled 2017-07-13: qty 4

## 2017-07-13 MED ORDER — HYDROCODONE-ACETAMINOPHEN 5-325 MG PO TABS: 1.0000 | ORAL_TABLET | ORAL | 0 refills | Status: DC | PRN

## 2017-07-13 MED ORDER — METHYLERGONOVINE MALEATE 0.2 MG/ML IJ SOLN
0.2000 mg | Freq: Once | INTRAMUSCULAR | Status: DC
  Filled 2017-07-13: qty 1

## 2017-07-13 MED ORDER — GI COCKTAIL ~~LOC~~
30.0000 mL | Freq: Once | ORAL | Status: AC
  Administered 2017-07-13: 30 mL via ORAL
  Filled 2017-07-13: qty 30

## 2017-07-13 MED ORDER — FERROUS SULFATE 325 (65 FE) MG PO TABS: 325.0000 mg | ORAL_TABLET | Freq: Every day | ORAL | 0 refills | Status: DC

## 2017-07-13 MED ORDER — SODIUM CHLORIDE 0.9 % IV SOLN
10.0000 mL/h | Freq: Once | INTRAVENOUS | Status: AC
  Administered 2017-07-13: 10 mL/h via INTRAVENOUS

## 2017-07-13 NOTE — MAU Note (Signed)
POC- SPECIMEN   TAKEN  TO LAB

## 2017-07-13 NOTE — Discharge Instructions (Signed)
Aborto espontáneo °(Miscarriage) °El aborto espontáneo es la pérdida de un bebé que no ha nacido (feto) antes de la semana 20 del embarazo. La mayor parte de estos abortos ocurre en los primeros 3 meses. En algunos casos ocurre antes de que la mujer sepa que está embarazada. También se denomina "aborto espontáneo" o "pérdida prematura del embarazo". El aborto espontáneo puede ser una experiencia que afecte emocionalmente a la persona. Converse con su médico si tiene dudas, cómo es el proceso de duelo, y sobre planes futuros de embarazo. °CAUSAS °· Algunos problemas cromosómicos pueden hacer imposible que el bebé se desarrolle normalmente. Los problemas con los genes o cromosomas del bebé son generalmente el resultado de errores que se producen, por casualidad, cuando el embrión se divide y crece. Estos problemas no se heredan de los padres. °· Infección en el cuello del útero. °· Problemas hormonales. °· Problemas en el cuello del útero, como tener un útero incompetente. Esto ocurre cuando los tejidos no son lo suficientemente fuertes como para contener el embarazo. °· Problemas del útero, como un útero con forma anormal, los fibromas o anormalidades congénitas. °· Ciertas enfermedades crónicas. °· No fume, no beba alcohol, ni consuma drogas. °· Traumatismos °A veces, la causa es desconocida. °SÍNTOMAS °· Sangrado o manchado vaginal, con o sin cólicos o dolor. °· Dolor o cólicos en el abdomen o en la cintura. °· Eliminación de líquido, tejidos o coágulos grandes por la vagina. ° °DIAGNÓSTICO °El médico le hará un examen físico. También le indicará una ecografía para confirmar el aborto. Es posible que se realicen análisis de sangre. °TRATAMIENTO °· En algunos casos el tratamiento no es necesario, si se eliminan naturalmente todos los tejidos embrionarios que se encontraban en el útero. Si el feto o la placenta quedan dentro del útero (aborto incompleto), pueden infectarse, los tejidos que quedan pueden infectarse y  deben retirarse. Generalmente se realiza un procedimiento de dilatación y curetaje (D y C). Durante el procedimiento de dilatación y curetaje, el cuello del útero se abre (dilata) y se retira cualquier resto de tejido fetal o placentario del útero. °· Si hay una infección, le recetarán antibióticos. Podrán recetarle otros medicamentos para reducir el tamaño del útero (contraerlo) si hay una mucho sangrado. °· Si su sangre es Rh negativa y su bebé es Rh positivo, usted necesitará la inyección de inmunoglobulina Rh. Esta inyección protegerá a los futuros bebés de tener problemas de compatibilidad Rh en futuros embarazos. ° °INSTRUCCIONES PARA EL CUIDADO EN EL HOGAR °· El médico le indicará reposo en cama o le permitirá realizar actividades livianas. Vuelva a la actividad lentamente o según las indicaciones de su médico. °· Pídale a alguien que la ayude con las responsabilidades familiares y del hogar durante este tiempo. °· Lleve un registro de la cantidad y la saturación de las toallas higiénicas que utiliza cada día. Anote esta información °· No use tampones. No No se haga duchas vaginales ni tenga relaciones sexuales hasta que el médico la autorice. °· Sólo tome medicamentos de venta libre o recetados para calmar el dolor o el malestar, según las indicaciones de su médico. °· No tome aspirina. La aspirina puede ocasionar hemorragias. °· Concurra puntualmente a las citas de control con el médico. °· Si usted o su pareja tienen dificultades con el duelo, hable con su médico para buscar la ayuda psicológica que los ayude a enfrentar la pérdida del embarazo. Permítase el tiempo suficiente de duelo antes de quedar embarazada nuevamente. ° °SOLICITE ATENCIÓN MÉDICA DE INMEDIATO SI: °·   Siente calambres intensos o dolor en la espalda o en el abdomen. °· Tiene fiebre. °· Elimina grandes coágulos de sangre (del tamaño de una nuez o más) o tejidos por la vagina. Guarde lo que ha eliminado para que su médico lo examine. °· La  hemorragia aumenta. °· Observa una secreción vaginal espesa y con mal olor. °· Se siente mareada, débil, o se desmaya. °· Siente escalofríos. ° °ASEGÚRESE DE QUE: °· Comprende estas instrucciones. °· Controlará su enfermedad. °· Solicitará ayuda de inmediato si no mejora o si empeora. ° °Esta información no tiene como fin reemplazar el consejo del médico. Asegúrese de hacerle al médico cualquier pregunta que tenga. °Document Released: 07/04/2005 Document Revised: 01/19/2013 Document Reviewed: 11/13/2011 °Elsevier Interactive Patient Education © 2017 Elsevier Inc. ° °

## 2017-07-13 NOTE — Progress Notes (Signed)
Patient and husband?/father  other very upset over this situation and I offered prayer and got a New Testament in Spanish for them. Used Camera operator.   07/13/17 0000  Clinical Encounter Type  Visited With Patient and family together  Visit Type Initial  Referral From Nurse  Spiritual Encounters  Spiritual Needs Prayer  Stress Factors  Patient Stress Factors ([redacted] weeks along felt baby gift from God)

## 2017-07-13 NOTE — ED Notes (Signed)
Pt returned from ultrasound.  Pt very upset and crying

## 2017-07-13 NOTE — MAU Provider Note (Signed)
History     CSN: [id number]  Arrival date and time: 07/12/17 1850   First Provider Initiated Contact with Patient 07/13/17 0257      Chief Complaint  Patient presents with  . Vaginal Bleeding   Beth Russell is a 31 y.o. [phone] at [redacted]w[redacted]d who presents today as transfer from [facility] with vaginal bleeding and presumed SAB. She states that she started bleeding around 1500 yesterday, and it increased throughout the day. She also reports that she was having "contractions" all day. She was seen at [facility] and no IUP seen, presumed SAB in progress. Patient with heavy vaginal bleeding at [facility]. Patient given pitocin, cytotec and methergine. She was transferred here for further management.    Vaginal Bleeding  The patient's primary symptoms include pelvic pain and vaginal bleeding. This is a new problem. The current episode started yesterday. The problem occurs constantly. The problem has been gradually worsening. The pain is severe. The problem affects both sides. She is pregnant. Pertinent negatives include no chills, dysuria, fever, frequency, nausea, urgency or vomiting. The vaginal discharge was bloody. The vaginal bleeding is heavier than menses. She has been passing clots. She has been passing tissue. Nothing aggravates the symptoms. She has tried nothing for the symptoms.    Past Medical History:  Diagnosis Date  . History of sickle cell trait 08/25/2011  . Language barrier-spanish speaking only  08/25/2011  . No pertinent past medical history     Past Surgical History:  Procedure Laterality Date  . DILATION AND CURETTAGE OF UTERUS N/A 10/06/2014   Procedure: SUCTION DILATATION AND CURETTAGE / BAKRI BALLOON PLACEMENT / REPAIR OF VAGINAL LACERATION;  Surgeon: Lesly Dukes, MD;  Location: [facility];  Service: Gynecology;  Laterality: N/A;  . NO PAST SURGERIES      History reviewed. No pertinent family history.  Social History  Substance Use Topics  . Smoking status: Never Smoker  .  Smokeless tobacco: Never Used  . Alcohol use No    Allergies: No Known Allergies  Prescriptions Prior to Admission  Medication Sig Dispense Refill Last Dose  . prenatal vitamin w/FE, FA (PRENATAL 1 + 1) 27-1 MG TABS tablet Take 1 tablet by mouth daily at 12 noon.   10/05/2014 at Unknown time    Review of Systems  Constitutional: Negative for chills and fever.  Gastrointestinal: Negative for nausea and vomiting.  Genitourinary: Positive for pelvic pain and vaginal bleeding. Negative for dysuria, frequency and urgency.   Physical Exam   Blood pressure (!) 103/53, pulse 81, temperature 98.8 F (37.1 C), temperature source Oral, resp. rate 16, last menstrual period 04/22/2017, SpO2 100 %, unknown if currently breastfeeding.  Physical Exam  Nursing note and vitals reviewed. Constitutional: She is oriented to person, place, and time. She appears well-developed and well-nourished. No distress.  HENT:  Head: Normocephalic.  Cardiovascular: Normal rate.   Respiratory: Effort normal.  GI: Soft. There is no tenderness. There is no rebound.  Genitourinary:  Genitourinary Comments:  External: no lesion Vagina: small amount of blood seen  Cervix: pink, smooth, tissue noted at the ext os. Removed with ring forceps. Bleeding minimal after removal of tissue.     Neurological: She is alert and oriented to person, place, and time.  Skin: Skin is warm and dry. There is pallor.  Psychiatric: She has a normal mood and affect.   Results for orders placed or performed during the hospital encounter of 07/12/17 (from the past 24 hour(s))  Lipase, blood  Status: None   Collection Time: 07/12/17  7:11 PM  Result Value Ref Range   Lipase 36 11 - 51 U/L  Comprehensive metabolic panel     Status: None   Collection Time: 07/12/17  7:11 PM  Result Value Ref Range   Sodium 137 135 - 145 mmol/L   Potassium 3.5 3.5 - 5.1 mmol/L   Chloride 104 101 - 111 mmol/L   CO2 24 22 - 32 mmol/L   Glucose, Bld  98 65 - 99 mg/dL   BUN 9 6 - 20 mg/dL   Creatinine, Ser 1.61 0.44 - 1.00 mg/dL   Calcium 8.9 8.9 - 09.6 mg/dL   Total Protein 6.8 6.5 - 8.1 g/dL   Albumin 3.8 3.5 - 5.0 g/dL   AST 21 15 - 41 U/L   ALT 15 14 - 54 U/L   Alkaline Phosphatase 50 38 - 126 U/L   Total Bilirubin 0.5 0.3 - 1.2 mg/dL   GFR calc non Af Amer >60 >60 mL/min   GFR calc Af Amer >60 >60 mL/min   Anion gap 9 5 - 15  CBC     Status: Abnormal   Collection Time: 07/12/17  7:11 PM  Result Value Ref Range   WBC 7.5 4.0 - 10.5 K/uL   RBC 3.86 (L) 3.87 - 5.11 MIL/uL   Hemoglobin 11.9 (L) 12.0 - 15.0 g/dL   HCT 04.5 (L) 40.9 - 81.1 %   MCV 89.9 78.0 - 100.0 fL   MCH 30.8 26.0 - 34.0 pg   MCHC 34.3 30.0 - 36.0 g/dL   RDW 91.4 78.2 - 95.6 %   Platelets 217 150 - 400 K/uL  hCG, quantitative, pregnancy     Status: Abnormal   Collection Time: 07/12/17  7:11 PM  Result Value Ref Range   hCG, Beta Chain, Quant, S 2,779 (H) <5 mIU/mL  Urinalysis, Routine w reflex microscopic     Status: Abnormal   Collection Time: 07/12/17  7:14 PM  Result Value Ref Range   Color, Urine YELLOW YELLOW   APPearance HAZY (A) CLEAR   Specific Gravity, Urine 1.015 1.005 - 1.030   pH 6.0 5.0 - 8.0   Glucose, UA NEGATIVE NEGATIVE mg/dL   Hgb urine dipstick LARGE (A) NEGATIVE   Bilirubin Urine NEGATIVE NEGATIVE   Ketones, ur NEGATIVE NEGATIVE mg/dL   Protein, ur NEGATIVE NEGATIVE mg/dL   Nitrite NEGATIVE NEGATIVE   Leukocytes, UA TRACE (A) NEGATIVE   RBC / HPF TOO NUMEROUS TO COUNT 0 - 5 RBC/hpf   WBC, UA 6-30 0 - 5 WBC/hpf   Bacteria, UA FEW (A) NONE SEEN   Squamous Epithelial / LPF 0-5 (A) NONE SEEN   Mucus PRESENT   Wet prep, genital     Status: Abnormal   Collection Time: 07/12/17 11:25 PM  Result Value Ref Range   Yeast Wet Prep HPF POC NONE SEEN NONE SEEN   Trich, Wet Prep NONE SEEN NONE SEEN   Clue Cells Wet Prep HPF POC NONE SEEN NONE SEEN   WBC, Wet Prep HPF POC FEW (A) NONE SEEN   Sperm NONE SEEN   ABO/Rh     Status: None    Collection Time: 07/13/17 12:28 AM  Result Value Ref Range   ABO/RH(D) O POS    No rh immune globuloin NOT A RH IMMUNE GLOBULIN CANDIDATE, PT RH POSITIVE   Type and screen [facility]     Status: None (Preliminary result)   Collection Time:  07/13/17 12:28 AM  Result Value Ref Range   ABO/RH(D) O POS    Antibody Screen NEG    Sample Expiration 07/16/2017    Unit Number [id number]    Blood Component Type RBC LR PHER2    Unit division 00    Status of Unit ALLOCATED    Transfusion Status OK TO TRANSFUSE    Crossmatch Result Compatible    Unit Number [id number]    Blood Component Type RED CELLS,LR    Unit division 00    Status of Unit ALLOCATED    Transfusion Status OK TO TRANSFUSE    Crossmatch Result Compatible   Hemoglobin and hematocrit, blood     Status: Abnormal   Collection Time: 07/13/17 12:53 AM  Result Value Ref Range   Hemoglobin 11.4 (L) 12.0 - 15.0 g/dL   HCT 96.2 (L) 95.2 - 84.1 %  Prepare RBC     Status: None   Collection Time: 07/13/17  1:30 AM  Result Value Ref Range   Order Confirmation ORDER PROCESSED BY BLOOD BANK   CBC     Status: Abnormal   Collection Time: 07/13/17  4:01 AM  Result Value Ref Range   WBC 11.1 (H) 4.0 - 10.5 K/uL   RBC 2.99 (L) 3.87 - 5.11 MIL/uL   Hemoglobin 9.5 (L) 12.0 - 15.0 g/dL   HCT 32.4 (L) 40.1 - 02.7 %   MCV 89.6 78.0 - 100.0 fL   MCH 31.8 26.0 - 34.0 pg   MCHC 35.4 30.0 - 36.0 g/dL   RDW 25.3 66.4 - 40.3 %   Platelets 186 150 - 400 K/uL     MAU Course  Procedures  MDM 0330: Dr. Emelda Fear on the unit. He completed pelvic exam. Tissue removed. Will send to path.  Patient is able to ambulate without difficulty, and get up to the bathroom.  She has had vidodin for pain at this time. She reports that she is feeling better. Bleeding has been scant since POC removed. Ok for DC home.  Anemia from acute blood loss. Will DC home on iron.   Assessment and Plan   1. Spontaneous abortion   2. Vaginal  bleeding in pregnancy   3. Pelvic pain   4. [redacted] weeks gestation of pregnancy   5. Vaginal bleeding in pregnancy, first trimester   6. Anemia, unspecified type    DC home Comfort measures reviewed  Bleeding precautions RX: ferrous sulfate TID, vicodin PRN #12  Return to MAU as needed FU with OB as planned  Follow-up Information    [facility] for [facility] Follow up.   Specialty:  Obstetrics and Gynecology Why:  They will call you for an appointment.  Contact information: [address] [place] [address] [phone]           Thressa Sheller 07/13/2017, 4:27 AM

## 2017-07-13 NOTE — ED Notes (Signed)
Delay in lab draw,  Provider at bedside talking to pt.  Request that I wait.

## 2017-07-13 NOTE — ED Notes (Signed)
[facility] contacted for emergency transport to MAU; Beth Russell receiving

## 2017-07-13 NOTE — MAU Note (Signed)
PT  TRANSFERRED  FROM  [facility] -   PT SAYS WITH INTERPRETER -  SHE WENT TO [facility] IN SEPT - TOLD  HER  SHE WAS 9 WEEKS  PREG -   BY LMP.    ALL HAS BEEN OK UNTIL TONIGHT -    AT 1PM-  FEELING  UC   THEN AT 3 PM- UNDERWEAR  HAD  SPOTS  BLEEDING.      AT 6PM  WENT  TO [facility]-  THEN WHEN DR  DID  PELVIC  EXAM- BLEEDING  BECAME HEAVIER .

## 2017-07-13 NOTE — ED Notes (Signed)
Report called to [facility]  Given to Hope Budds, RN

## 2017-07-13 NOTE — MAU Note (Signed)
NOW  CRAMPING  IS  RATED  8.  NOW  NO BLEEDING.

## 2017-07-13 NOTE — ED Provider Notes (Signed)
31 year old female who is pregnant comes in with first trimester bleeding. Bleeding was very heavy. Ultrasound showed a miscarriage in process. Bleeding increased to where she was passing large clots and she became pale and somewhat diaphoretic. She was not hypotensive or tachycardic, but there is concern that she was starting to get shocky. She was given IV fluids and started on IV Pitocin. Because of persistent, large by him bleeding, it is felt that she needs to be evaluated by oncology for possible emergent D&C. Case was discussed with Dr. Emelda Fear at [facility] [place], who agrees to accept the patient in transfer.  CRITICAL CARE Performed by: Dione Booze Total critical care time: 35 minutes Critical care time was exclusive of separately billable procedures and treating other patients. Critical care was necessary to treat or prevent imminent or life-threatening deterioration. Critical care was time spent personally by me on the following activities: development of treatment plan with patient and/or surrogate as well as nursing, discussions with consultants, evaluation of patient's response to treatment, examination of patient, obtaining history from patient or surrogate, ordering and performing treatments and interventions, ordering and review of laboratory studies, ordering and review of radiographic studies, pulse oximetry and re-evaluation of patient's condition.  Medical screening examination/treatment/procedure(s) were conducted as a shared visit with non-physician practitioner(s) and myself.  I personally evaluated the patient during the encounter.     Dione Booze, MD 07/14/17 [phone]

## 2017-07-13 NOTE — MAU Note (Signed)
Pt presents to MAU via EMS from [facility]. Pt reports bleeding since 10/5 @ 1500. Before she started bleeding she started having contractions. She does reports having "a piece of flesh" come out of her vagina. Pt reports that she [facility] and has an appointment on 10/18.

## 2017-07-13 NOTE — ED Notes (Signed)
Pt pale, skin diaphoretic,  Pt passing large blood clots.  MD at bedside.

## 2017-07-13 NOTE — ED Notes (Signed)
2 units of blood ready. Victorino Dike NS notified @ [phone].

## 2017-07-15 ENCOUNTER — Inpatient Hospital Stay (HOSPITAL_COMMUNITY)
Admission: AD | Admit: 2017-07-15 | Discharge: 2017-07-15 | Disposition: A | Discharge status: Home / Self Care | Payer: Self-pay | Source: Ambulatory Visit | Attending: Obstetrics and Gynecology | Admitting: Obstetrics and Gynecology

## 2017-07-15 DIAGNOSIS — O039 Complete or unspecified spontaneous abortion without complication: Secondary | ICD-10-CM | POA: Insufficient documentation

## 2017-07-15 DIAGNOSIS — Z3A11 11 weeks gestation of pregnancy: Secondary | ICD-10-CM | POA: Insufficient documentation

## 2017-07-15 DIAGNOSIS — R109 Unspecified abdominal pain: Secondary | ICD-10-CM

## 2017-07-15 LAB — TYPE AND SCREEN
ABO/RH(D): O POS
Antibody Screen: NEGATIVE
UNIT DIVISION: 0
Unit division: 0

## 2017-07-15 LAB — BPAM RBC
Blood Product Expiration Date: 201810302359
Blood Product Expiration Date: 201810302359
UNIT TYPE AND RH: 5100
Unit Type and Rh: 5100

## 2017-07-15 LAB — GC/CHLAMYDIA PROBE AMP (~~LOC~~) NOT AT ARMC
Chlamydia: NEGATIVE
NEISSERIA GONORRHEA: NEGATIVE

## 2017-07-15 MED ORDER — HYDROCODONE-ACETAMINOPHEN 5-325 MG PO TABS: 1.0000 | ORAL_TABLET | Freq: Four times a day (QID) | ORAL | 0 refills | Status: DC | PRN

## 2017-07-15 MED ORDER — IBUPROFEN 600 MG PO TABS: 600.0000 mg | ORAL_TABLET | Freq: Four times a day (QID) | ORAL | 1 refills | Status: DC | PRN

## 2017-07-15 NOTE — MAU Note (Signed)
Pt presents to MAU because she lost her narcotic prescription and wants another one

## 2017-07-15 NOTE — MAU Provider Note (Signed)
S: 31 y.o. [id number] presents to MAU Day 2 following SAB @ [redacted] weeks gestation reporting she was unable to pick up her Vicodin prescription and now has lost the paper script. She was seen on 10/6 in MAU, transferred from [facility] for SAB with heavy bleeding.  She was treated with Cytotec and methergine and POCs were removed during pelvic exam.  Pt bleeding slowed considerably and she was discharge home in stable condition. She reports her husband tried 3 different pharmacies but could not pick up the prescription for her.  She reports the pain is much less but still present, pain in her abdomen as well as some pain in her legs and her teeth which started yesterday.  She denies dizziness, h/a, chest pain, n/v, or fatigue.    Video interpreter # [phone] used for spanish language.  O: Patient Vitals for the past 24 hrs:  BP Temp Pulse  07/15/17 1127 (!) 113/56 98.2 F (36.8 C) (!) 111   VS reviewed, nursing note reviewed,  Constitutional: well developed, well nourished, no distress HEENT: normocephalic HEART: normal rate, heart sounds, regular rhythm RESP: normal effort, lung sounds clear and equal bilaterally Abdomen: soft Neuro: alert and oriented x 3 Skin: warm, dry Psych: affect normal  A: 1. SAB (spontaneous abortion)   2. Abdominal pain in female patient     P: D/C home Rx for ibuprofen and Vicodin F/U in [facility] in 1 week for hcg and 2 weeks for provider visit Return to MAU as needed for emergencies  Sharen Counter, CNM 11:59 AM

## 2017-07-22 ENCOUNTER — Other Ambulatory Visit: Payer: Self-pay | Admitting: Advanced Practice Midwife

## 2017-07-22 ENCOUNTER — Other Ambulatory Visit: Payer: Self-pay

## 2017-07-22 DIAGNOSIS — O039 Complete or unspecified spontaneous abortion without complication: Secondary | ICD-10-CM

## 2017-07-25 LAB — BETA HCG QUANT (REF LAB): hCG Quant: 15 m[IU]/mL

## 2017-07-25 LAB — SPECIMEN STATUS REPORT

## 2017-07-29 ENCOUNTER — Ambulatory Visit (INDEPENDENT_AMBULATORY_CARE_PROVIDER_SITE_OTHER): Payer: Self-pay | Admitting: Advanced Practice Midwife

## 2017-07-29 ENCOUNTER — Encounter: Payer: Self-pay | Admitting: Advanced Practice Midwife

## 2017-07-29 VITALS — BP 103/61 | HR 85

## 2017-07-29 DIAGNOSIS — O039 Complete or unspecified spontaneous abortion without complication: Secondary | ICD-10-CM

## 2017-07-29 MED ORDER — NORGESTIMATE-ETH ESTRADIOL 0.25-35 MG-MCG PO TABS: 1.0000 | ORAL_TABLET | Freq: Every day | ORAL | 11 refills | Status: DC

## 2017-07-29 NOTE — Progress Notes (Signed)
Pt stated still having sharp pain both side of the lower abdominal when sitting or standing.

## 2017-07-29 NOTE — Progress Notes (Signed)
Subjective:     Patient ID: Zonia Kiefelia Castro Rayon, female   DOB: 08-07-1986, 31 y.o.   MRN: [id number]  Wendi MayaDelia Izora GalaCastro Rayon is a 31 y.o. [phone] who presents for a follow up after a spontaneous abortion on 07/15/17. She denies any vaginal bleeding or discharge at this time. She reports intermittent pain in her lower abdomen that she rates a 5/10. She has tried ibuprofen for the pain with relief. The pain is worse with standing or moving for long periods of time. She is not sexually active.     Review of Systems  Constitutional: Negative.  Negative for fatigue and fever.  HENT: Negative.   Respiratory: Negative.  Negative for shortness of breath.   Cardiovascular: Negative.  Negative for chest pain.  Gastrointestinal: Positive for abdominal pain. Negative for constipation, diarrhea, nausea and vomiting.  Genitourinary: Negative.  Negative for dysuria.  Neurological: Negative.  Negative for dizziness and headaches.       Objective:   Physical Exam  Constitutional: She is oriented to person, place, and time. She appears well-developed and well-nourished.  HENT:  Head: Normocephalic.  Cardiovascular: Normal rate, regular rhythm and normal heart sounds.   Pulmonary/Chest: Effort normal and breath sounds normal.  Abdominal: Soft. Bowel sounds are normal. She exhibits no distension. There is no tenderness.  Neurological: She is alert and oriented to person, place, and time.  Skin: Skin is warm and dry.  Psychiatric: She has a normal mood and affect. Her behavior is normal. Judgment and thought content normal.  Nursing note and vitals reviewed.  Vitals:   07/29/17 1254  BP: 103/61  Pulse: 85      Assessment:     1. SAB (spontaneous abortion)       Plan:     HCG today Prescription for OCPs sent to patient's pharmacy. Patient desires nexplanon- will go to health department for insertion and pap smear Follow up in 1 year for annual exam or sooner if needed.

## 2017-07-29 NOTE — Patient Instructions (Signed)
FREE PAP CLINIC: Call [phone] Tell them you would like to sign up for free pap clinic   CLINICA DE PAP GRATIS: [place] [phone] Diles que te gustaria inscribirte en una clinica de papanicolau gratis

## 2017-07-30 LAB — HCG, BETA SUBUNIT, QN (SERIAL): HCG, Beta Chain, Quant, S: 5 m[IU]/mL

## 2017-11-12 ENCOUNTER — Other Ambulatory Visit: Payer: Self-pay

## 2017-11-12 ENCOUNTER — Encounter (HOSPITAL_COMMUNITY): Payer: Self-pay | Admitting: Emergency Medicine

## 2017-11-12 ENCOUNTER — Ambulatory Visit (HOSPITAL_COMMUNITY)
Admission: EM | Admit: 2017-11-12 | Discharge: 2017-11-12 | Disposition: A | Discharge status: Home / Self Care | Payer: Self-pay | Attending: Emergency Medicine | Admitting: Emergency Medicine

## 2017-11-12 DIAGNOSIS — J0110 Acute frontal sinusitis, unspecified: Secondary | ICD-10-CM

## 2017-11-12 MED ORDER — IBUPROFEN 600 MG PO TABS: 600.0000 mg | ORAL_TABLET | Freq: Four times a day (QID) | ORAL | 1 refills | Status: DC | PRN

## 2017-11-12 MED ORDER — FLUTICASONE PROPIONATE 50 MCG/ACT NA SUSP: 1.0000 | Freq: Every day | NASAL | 2 refills | Status: DC

## 2017-11-12 MED ORDER — AMOXICILLIN-POT CLAVULANATE 875-125 MG PO TABS: 1.0000 | ORAL_TABLET | Freq: Two times a day (BID) | ORAL | 0 refills | Status: AC

## 2017-11-12 NOTE — ED Provider Notes (Signed)
[facility]    CSN: [id number] Arrival date & time: 11/12/17  1503     History   Chief Complaint No chief complaint on file.   HPI Beth Russell is a 32 y.o. female.   Beth Russell presents with complaints of headache which is a pressure with cough, congestion, runny nose and sore throat. 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 This started 3 weeks ago. Headache worse over the past few days. Makes her teeth hurt eve. No fevers. Took motrin last night which helped some but pain returned this morning. No known ill contacts. Without contributing medical history. Does not take any medications daily.   ROS per HPI.       Past Medical History:  Diagnosis Date  . History of sickle cell trait 08/25/2011  . Language barrier-spanish speaking only  08/25/2011  . No pertinent past medical history     Patient Active Problem List   Diagnosis Date Noted  . Active labor 10/06/2014  . Gestation size to date discrepancy   . UTI (urinary tract infection) in pregnancy in third trimester 07/31/2014  . Constipation during pregnancy 07/28/2014  . Supervision of normal pregnancy 08/25/2011  . History of sickle cell trait 08/25/2011  . Language barrier-spanish speaking only  08/25/2011    Past Surgical History:  Procedure Laterality Date  . DILATION AND CURETTAGE OF UTERUS N/A 10/06/2014   Procedure: SUCTION DILATATION AND CURETTAGE / BAKRI BALLOON PLACEMENT / REPAIR OF VAGINAL LACERATION;  Surgeon: Lesly Dukes, MD;  Location: [facility];  Service: Gynecology;  Laterality: N/A;  . NO PAST SURGERIES      OB History    Gravida Para Term Preterm AB Living   4 3 3  0 0 3   SAB TAB Ectopic Multiple Live Births   0 0 0 0 3       Home Medications    Prior to Admission medications   Medication Sig Start Date End Date Taking? Authorizing Provider  amoxicillin-clavulanate (AUGMENTIN) 875-125 MG tablet Take 1 tablet by mouth every 12 (twelve) hours for 10  days. 11/12/17 11/22/17  Linus Mako B, NP  ferrous sulfate 325 (65 FE) MG tablet Take 1 tablet (325 mg total) by mouth daily. 07/13/17   Armando Reichert, CNM  fluticasone (FLONASE) 50 MCG/ACT nasal spray Place 1 spray into both nostrils daily. 11/12/17   Georgetta Haber, NP  HYDROcodone-acetaminophen (NORCO/VICODIN) 5-325 MG tablet Take 1-2 tablets by mouth every 6 (six) hours as needed. 07/15/17   Leftwich-Kirby, Wilmer Floor, CNM  ibuprofen (ADVIL,MOTRIN) 600 MG tablet Take 1 tablet (600 mg total) by mouth every 6 (six) hours as needed. 11/12/17   Georgetta Haber, NP  norgestimate-ethinyl estradiol (ORTHO-CYCLEN,SPRINTEC,PREVIFEM) 0.25-35 MG-MCG tablet Take 1 tablet by mouth daily. 07/29/17   Rolm Bookbinder, CNM  prenatal vitamin w/FE, FA (PRENATAL 1 + 1) 27-1 MG TABS tablet Take 1 tablet by mouth daily at 12 noon.    [provider]    Family History History reviewed. No pertinent family history.  Social History Social History   Tobacco Use  . Smoking status: Never Smoker  . Smokeless tobacco: Never Used  Substance Use Topics  . Alcohol use: No  . Drug use: No     Allergies   Patient has no known allergies.   Review of Systems Review of Systems   Physical Exam Triage Vital Signs ED Triage Vitals  Enc Vitals Group     BP 11/12/17 1700 Marland Kitchen)  103/55     Pulse Rate 11/12/17 1700 85     Resp 11/12/17 1700 18     Temp 11/12/17 1700 98.2 F (36.8 C)     Temp Source 11/12/17 1700 Oral     SpO2 11/12/17 1700 100 %     Weight --      Height --      Head Circumference --      Peak Flow --      Pain Score 11/12/17 1653 10     Pain Loc --      Pain Edu? --      Excl. in GC? --    No data found.  Updated Vital Signs BP (!) 103/55 (BP Location: Left Arm)   Pulse 85   Temp 98.2 F (36.8 C) (Oral)   Resp 18   LMP 11/07/2017   SpO2 100%   Visual Acuity Right Eye Distance:   Left Eye Distance:   Bilateral Distance:    Right Eye Near:   Left Eye Near:      Bilateral Near:     Physical Exam  Constitutional: She is oriented to person, place, and time. She appears well-developed and well-nourished. No distress.  HENT:  Head: Normocephalic and atraumatic.  Right Ear: Tympanic membrane, external ear and ear canal normal.  Left Ear: Tympanic membrane, external ear and ear canal normal.  Nose: Mucosal edema and rhinorrhea present. Right sinus exhibits maxillary sinus tenderness and frontal sinus tenderness. Left sinus exhibits maxillary sinus tenderness and frontal sinus tenderness.  Mouth/Throat: Uvula is midline, oropharynx is clear and moist and mucous membranes are normal. No tonsillar exudate.  Eyes: Conjunctivae and EOM are normal. Pupils are equal, round, and reactive to light.  Cardiovascular: Normal rate, regular rhythm and normal heart sounds.  Pulmonary/Chest: Effort normal and breath sounds normal.  Neurological: She is alert and oriented to person, place, and time.  Skin: Skin is warm and dry.     UC Treatments / Results  Labs (all labs ordered are listed, but only abnormal results are displayed) Labs Reviewed - No data to display  EKG  EKG Interpretation None       Radiology No results found.  Procedures Procedures (including critical care time)  Medications Ordered in UC Medications - No data to display   Initial Impression / Assessment and Plan / UC Course  I have reviewed the triage vital signs and the nursing notes.  Pertinent labs & imaging results that were available during my care of the patient were reviewed by me and considered in my medical decision making (see chart for details).     Antibiotics for sinusitis, flonase daily. Ibuprofen for pain. Push fluids. Return precautions provided. Patient verbalized understanding and agreeable to plan.    Final Clinical Impressions(s) / UC Diagnoses   Final diagnoses:  Acute non-recurrent frontal sinusitis    ED Discharge Orders        Ordered     amoxicillin-clavulanate (AUGMENTIN) 875-125 MG tablet  Every 12 hours     11/12/17 1714    fluticasone (FLONASE) 50 MCG/ACT nasal spray  Daily     11/12/17 1714    ibuprofen (ADVIL,MOTRIN) 600 MG tablet  Every 6 hours PRN     11/12/17 1715       Controlled Substance Prescriptions [place] Controlled Substance Registry consulted? Not Applicable   Georgetta HaberBurky, Natalie B, NP 11/12/17 1729

## 2017-11-12 NOTE — ED Triage Notes (Signed)
[id number]  Complains of back pain, pain above breast and has a headache.  Back pain and chest for 3 weeks, but has started to get stronger.  Pain with movement.  Denies injury. She has had a cough and flu symptoms about 2 weeks ago.  Head pain is behind eyes and involves much of the face

## 2017-11-12 NOTE — Discharge Instructions (Addendum)
Push fluids to ensure adequate hydration and keep secretions thin.  Tylenol and/or ibuprofen as needed for pain or fevers.  Complete course of antibiotics. Use of daily nasal spray.  If symptoms worsen or do not improve in the next week to return to be seen or to follow up with your primary care provider.

## 2017-12-18 ENCOUNTER — Encounter (HOSPITAL_COMMUNITY): Payer: Self-pay | Admitting: Emergency Medicine

## 2017-12-18 ENCOUNTER — Ambulatory Visit (HOSPITAL_COMMUNITY)
Admission: EM | Admit: 2017-12-18 | Discharge: 2017-12-18 | Disposition: A | Discharge status: Home / Self Care | Payer: Self-pay | Attending: Family Medicine | Admitting: Family Medicine

## 2017-12-18 DIAGNOSIS — R51 Headache: Secondary | ICD-10-CM

## 2017-12-18 DIAGNOSIS — R519 Headache, unspecified: Secondary | ICD-10-CM

## 2017-12-18 MED ORDER — LEVOFLOXACIN 500 MG PO TABS: 500.0000 mg | ORAL_TABLET | Freq: Every day | ORAL | 0 refills | Status: DC

## 2017-12-18 NOTE — ED Triage Notes (Signed)
Pt c/o headache and facial pain x4 days. Denies other symptoms.

## 2017-12-18 NOTE — ED Provider Notes (Signed)
  [facility]   [id number] 12/18/17 Arrival Time: 1041   SUBJECTIVE:  Danille Izora GalaCastro Rayon is a 32 y.o. female who presents to the urgent care with complaint of headache and facial pain x4 days. Denies other symptoms Was treated for sinus infection 2 weeks ago with Augmentin and did get somewhat better. She thinks the Flonase sprays helping the most. Patient is not working currently. She denies any possibility of pregnancy. Past Medical History:  Diagnosis Date  . History of sickle cell trait 08/25/2011  . Language barrier-spanish speaking only  08/25/2011  . No pertinent past medical history    History reviewed. No pertinent family history. Social History   Socioeconomic History  . Marital status: Single    Spouse name: Not on file  . Number of children: Not on file  . Years of education: Not on file  . Highest education level: Not on file  Social Needs  . Financial resource strain: Not on file  . Food insecurity - worry: Not on file  . Food insecurity - inability: Not on file  . Transportation needs - medical: Not on file  . Transportation needs - non-medical: Not on file  Occupational History  . Not on file  Tobacco Use  . Smoking status: Never Smoker  . Smokeless tobacco: Never Used  Substance and Sexual Activity  . Alcohol use: No  . Drug use: No  . Sexual activity: Yes    Birth control/protection: None  Other Topics Concern  . Not on file  Social History Narrative  . Not on file   No outpatient medications have been marked as taking for the 12/18/17 encounter [facility](Hospital Encounter).   No Known Allergies    ROS: As per HPI, remainder of ROS negative.   OBJECTIVE:   Vitals:   12/18/17 1201  BP: (!) 101/41  Pulse: 70  Resp: 18  Temp: 98.2 F (36.8 C)  SpO2: 100%     General appearance: alert; no distress Eyes: PERRL; EOMI; conjunctiva normal HENT: normocephalic; atraumatic; TMs normal, canal normal, external ears normal without trauma;  nasal mucosa normal; oral mucosa normal Neck: supple Lungs: clear to auscultation bilaterally Heart: regular rate and rhythm Abdomen: soft, non-tender; bowel sounds normal; no masses or organomegaly; no guarding or rebound tenderness Back: no CVA tenderness Extremities: no cyanosis or edema; symmetrical with no gross deformities Skin: warm and dry Neurologic: normal gait; grossly normal Psychological: alert and cooperative; normal mood and affect      Labs:  Results for orders placed or performed in visit on 07/29/17  hCG, Beta Subunit, Qn (Serial)  Result Value Ref Range   HCG, Beta Chain, Quant, S 5 mIU/mL    Labs Reviewed - No data to display  No results found.     ASSESSMENT & PLAN:  1. Sinus headache     Meds ordered this encounter  Medications  . levofloxacin (LEVAQUIN) 500 MG tablet    Sig: Take 1 tablet (500 mg total) by mouth daily.    Dispense:  10 tablet    Refill:  0    Reviewed expectations re: course of current medical issues. Questions answered. Outlined signs and symptoms indicating need for more acute intervention. Patient verbalized understanding. After Visit Summary given.       Elvina SidleLauenstein, Jeryl Umholtz, MD 12/18/17 1228

## 2017-12-26 ENCOUNTER — Ambulatory Visit: Payer: Self-pay | Admitting: Internal Medicine

## 2017-12-26 ENCOUNTER — Encounter: Payer: Self-pay | Admitting: Internal Medicine

## 2017-12-26 VITALS — BP 110/68 | HR 76 | Resp 12 | Ht 60.0 in | Wt 146.0 lb

## 2017-12-26 DIAGNOSIS — K0889 Other specified disorders of teeth and supporting structures: Secondary | ICD-10-CM

## 2017-12-26 DIAGNOSIS — F4321 Adjustment disorder with depressed mood: Secondary | ICD-10-CM

## 2017-12-26 DIAGNOSIS — H547 Unspecified visual loss: Secondary | ICD-10-CM

## 2017-12-26 NOTE — Progress Notes (Signed)
Subjective:    Patient ID: Beth Russell, female    DOB: 1986-04-15, 32 y.o.   MRN: [id number]  HPI   Here to establish  Lost her pregnancy at 4.5 months in October.  Since then, she has developed diffuse teeth pain.  Also her maxillary area and around her eyes hurt as well. Her eyes hurt also. She does not see well.   Her husband has not told her she grinds her teeth at night.  She does have cavities. She is also frequently sleeping with one of her children as they are not sleeping.  Generally 2 times weekly.  Has a 163 yo daughter and 426 yo son.  Also, has a 31 yo.   If she clenches her teeth, the pain sometimes is less.  She would be interested in speaking with someone about the loss of her baby. Has taken Ibuprofen for the pain with some help   No outpatient medications have been marked as taking for the 12/26/17 encounter (Office Visit) with Julieanne MansonMulberry, Marissia Blackham, MD.   No Known Allergies  Past Medical History:  Diagnosis Date  . History of sickle cell trait 08/25/2011    Past Surgical History:  Procedure Laterality Date  . DILATION AND CURETTAGE OF UTERUS N/A 10/06/2014   Procedure: SUCTION DILATATION AND CURETTAGE / BAKRI BALLOON PLACEMENT / REPAIR OF VAGINAL LACERATION;  Surgeon: Lesly DukesKelly H Leggett, MD;  Location: [facility];  Service: Gynecology;  Laterality: N/A;   No family history on file.   Social History   Socioeconomic History  . Marital status: Married    Spouse name: Theotis Barrioomas  . Number of children: 3  . Years of education: 9  . Highest education level: Not on file  Occupational History  . Not on file  Social Needs  . Financial resource strain: Not very hard  . Food insecurity:    Worry: Never true    Inability: Never true  . Transportation needs:    Medical: No    Non-medical: No  Tobacco Use  . Smoking status: Never Smoker  . Smokeless tobacco: Never Used  Substance and Sexual Activity  . Alcohol use: No  . Drug use: No  . Sexual activity: Yes   Birth control/protection: Condom  Lifestyle  . Physical activity:    Days per week: Not on file    Minutes per session: Not on file  . Stress: Not on file  Relationships  . Social connections:    Talks on phone: Not on file    Gets together: Not on file    Attends religious service: Not on file    Active member of club or organization: Not on file    Attends meetings of clubs or organizations: Not on file    Relationship status: Not on file  . Intimate partner violence:    Fear of current or ex partner: Not on file    Emotionally abused: Not on file    Physically abused: Not on file    Forced sexual activity: Not on file  Other Topics Concern  . Not on file  Social History Narrative  . Not on file     Review of Systems     Objective:   Physical Exam   NAD HEENT: PERRL, EOMI, TMs pearly gray, throat without injection.  Multiple dark cavities, gray discoloration of teeth.  Left lower wisdom tooth appears brown with overlying gingiva.  No gingival erythema or fluctuance.   Tender over musculature of jaw bilaterally. Neck:  Supple, No adenopathy Chest:  CTA CV:  RRR with normal S1 and S2, No S3, S4 or murmur.  Radial pulses normal and equal.        Assessment & Plan:  1.  Facial and dental pain:  Diffuse and bilateral.  Feel she is likely grinding or clenching teeth.   She is not interested in muscle relaxant at night as she needs to be able to awaken easily to care for her young children who get up in night. Does also have dental decay and will refer to dental clinic. Decreased visual acuity may be an additional element to pain--refer to optometry. Bite Block to wear at night. Ibuprofen 600 mg before bed and during day when pain is more prominent.  Doses at least 6 hours apart.  2.  Loss of pregnancy:  Possible trigger for pain above.  Exie Parody with patient to assess for need for counseling currently--warm hand off.

## 2017-12-26 NOTE — Patient Instructions (Signed)
Bite block from [employer] and wear nightly. Ibuprofen 200 mg 2-3 tabs twice daily, one dose before bedtime the other during the day at least 6 hours apart and with food.

## 2018-01-13 ENCOUNTER — Emergency Department (HOSPITAL_COMMUNITY): Payer: Self-pay

## 2018-01-13 ENCOUNTER — Encounter (HOSPITAL_COMMUNITY): Payer: Self-pay | Admitting: Neurology

## 2018-01-13 ENCOUNTER — Emergency Department (HOSPITAL_COMMUNITY)
Admission: EM | Admit: 2018-01-13 | Discharge: 2018-01-13 | Disposition: A | Discharge status: Home / Self Care | Payer: Self-pay | Attending: Emergency Medicine | Admitting: Emergency Medicine

## 2018-01-13 DIAGNOSIS — H9201 Otalgia, right ear: Secondary | ICD-10-CM | POA: Insufficient documentation

## 2018-01-13 DIAGNOSIS — Z79899 Other long term (current) drug therapy: Secondary | ICD-10-CM | POA: Insufficient documentation

## 2018-01-13 DIAGNOSIS — R6884 Jaw pain: Secondary | ICD-10-CM | POA: Insufficient documentation

## 2018-01-13 LAB — BASIC METABOLIC PANEL
ANION GAP: 11 (ref 5–15)
BUN: 10 mg/dL (ref 6–20)
CHLORIDE: 105 mmol/L (ref 101–111)
CO2: 22 mmol/L (ref 22–32)
Calcium: 9.3 mg/dL (ref 8.9–10.3)
Creatinine, Ser: 0.61 mg/dL (ref 0.44–1.00)
GFR calc non Af Amer: >60 mL/min (ref >60)
Glucose, Bld: 108 mg/dL — ABNORMAL HIGH (ref 65–99)
Potassium: 4.1 mmol/L (ref 3.5–5.1)
SODIUM: 138 mmol/L (ref 135–145)

## 2018-01-13 LAB — CBC WITH DIFFERENTIAL/PLATELET
BASOS ABS: 0 10*3/uL (ref 0.0–0.1)
Basophils Relative: 1 %
EOS ABS: 0.2 10*3/uL (ref 0.0–0.7)
Eosinophils Relative: 4 %
HEMATOCRIT: 37 % (ref 36.0–46.0)
HEMOGLOBIN: 12.5 g/dL (ref 12.0–15.0)
Lymphocytes Relative: 29 %
Lymphs Abs: 1.9 10*3/uL (ref 0.7–4.0)
MCH: 28.6 pg (ref 26.0–34.0)
MCHC: 33.8 g/dL (ref 30.0–36.0)
MCV: 84.7 fL (ref 78.0–100.0)
Monocytes Absolute: 0.4 10*3/uL (ref 0.1–1.0)
Monocytes Relative: 7 %
NEUTROS ABS: 4 10*3/uL (ref 1.7–7.7)
Neutrophils Relative %: 61 %
Platelets: 225 10*3/uL (ref 150–400)
RBC: 4.37 MIL/uL (ref 3.87–5.11)
RDW: 14.6 % (ref 11.5–15.5)
WBC: 6.6 10*3/uL (ref 4.0–10.5)

## 2018-01-13 LAB — RAPID STREP SCREEN (MED CTR MEBANE ONLY): STREPTOCOCCUS, GROUP A SCREEN (DIRECT): NEGATIVE

## 2018-01-13 LAB — I-STAT BETA HCG BLOOD, ED (MC, WL, AP ONLY)

## 2018-01-13 MED ORDER — IOPAMIDOL (ISOVUE-300) INJECTION 61%
INTRAVENOUS | Status: AC
  Filled 2018-01-13: qty 100

## 2018-01-13 MED ORDER — IOPAMIDOL (ISOVUE-300) INJECTION 61%
100.0000 mL | Freq: Once | INTRAVENOUS | Status: AC | PRN
  Administered 2018-01-13: 75 mL via INTRAVENOUS

## 2018-01-13 NOTE — ED Provider Notes (Signed)
[facility] EMERGENCY DEPARTMENT Provider Note   CSN: [id number] Arrival date & time: 01/13/18  1304     History   Chief Complaint Chief Complaint  Patient presents with  . Jaw Pain  . Facial Pain    HPI Beth Russell is a 32 y.o. female who presents today for evaluation of jaw pain.  She was seen for this and sinus infection approximately 3 weeks ago and she was treated with Augmentin.  She reports that since then her pain has continued.  She was then treated on 12/18/17 with Levaquin for continued sinus headache.  [facility] where she was diagnosed with dental pain.  The provider then noted that she had multiple cavities and that the area around her eyes hurt as well.  She reports to me today that this jaw pain has been going on for multiple months, however got worse on Wednesday.  She denies any fevers or chills.  She has been referred to an ophthalmologist for blurry vision in her right eye which appears to be chronic, however she has not seen them yet.  She was told to wear a bite block at night and take ibuprofen.      HPI  Past Medical History:  Diagnosis Date  . History of sickle cell trait 08/25/2011    Patient Active Problem List   Diagnosis Date Noted  . Active labor 10/06/2014  . Gestation size to date discrepancy   . UTI (urinary tract infection) in pregnancy in third trimester 07/31/2014  . Constipation during pregnancy 07/28/2014  . Supervision of normal pregnancy 08/25/2011  . History of sickle cell trait 08/25/2011  . Language barrier-spanish speaking only  08/25/2011    Past Surgical History:  Procedure Laterality Date  . DILATION AND CURETTAGE OF UTERUS N/A 10/06/2014   Procedure: SUCTION DILATATION AND CURETTAGE / BAKRI BALLOON PLACEMENT / REPAIR OF VAGINAL LACERATION;  Surgeon: Lesly Dukes, MD;  Location: [facility];  Service: Gynecology;  Laterality: N/A;     OB History    Gravida  4     Para  3   Term  3   Preterm  0   AB  0   Living  3     SAB  0   TAB  0   Ectopic  0   Multiple  0   Live Births  3            Home Medications    Prior to Admission medications   Medication Sig Start Date End Date Taking? Authorizing Provider  ibuprofen (ADVIL,MOTRIN) 600 MG tablet Take 1 tablet (600 mg total) by mouth every 6 (six) hours as needed. Patient not taking: Reported on 12/26/2017 11/12/17   Georgetta Haber, NP  prenatal vitamin w/FE, FA (PRENATAL 1 + 1) 27-1 MG TABS tablet Take 1 tablet by mouth daily at 12 noon.    [provider]    Family History No family history on file.  Social History Social History   Tobacco Use  . Smoking status: Never Smoker  . Smokeless tobacco: Never Used  Substance Use Topics  . Alcohol use: No  . Drug use: No     Allergies   Patient has no known allergies.   Review of Systems Review of Systems  Constitutional: Negative for chills and fever.  HENT: Positive for dental problem, ear pain and sore throat. Negative for congestion, ear discharge, hearing loss, mouth  sores, rhinorrhea, sinus pressure, sinus pain, trouble swallowing and voice change.   Eyes: Negative for visual disturbance.  Respiratory: Negative for cough, choking and shortness of breath.   Gastrointestinal: Negative for diarrhea, nausea and vomiting.  Genitourinary: Negative for dysuria.  Skin: Negative for rash.  Neurological: Negative for facial asymmetry and headaches.  Psychiatric/Behavioral: Negative for confusion.  All other systems reviewed and are negative.    Physical Exam Updated Vital Signs BP 122/72   Pulse 75   Temp 98.5 F (36.9 C)   Resp 17   LMP 12/13/2017   SpO2 100%   Physical Exam  Constitutional: She appears well-developed and well-nourished. No distress.  HENT:  Head: Normocephalic and atraumatic.  Right Ear: External ear and ear canal normal. Tympanic membrane is not erythematous, not retracted and  not bulging. A middle ear effusion is present.  Left Ear: External ear and ear canal normal. Tympanic membrane is not erythematous, not retracted and not bulging. A middle ear effusion is present.  Mouth/Throat: Uvula is midline, oropharynx is clear and moist and mucous membranes are normal. There is trismus (Mild) in the jaw. No uvula swelling or dental caries (No obvious dental carries. ). No oropharyngeal exudate, posterior oropharyngeal edema, posterior oropharyngeal erythema or tonsillar abscesses. Tonsils are 1+ on the right. Tonsils are 1+ on the left. No tonsillar exudate.  Bilateral serous effusions present.  Eyes: Conjunctivae are normal. Right eye exhibits no discharge. Left eye exhibits no discharge. No scleral icterus.  Neck: Normal range of motion. Neck supple.  Cardiovascular: Normal rate and regular rhythm.  Pulmonary/Chest: Effort normal. No stridor. No respiratory distress.  Abdominal: She exhibits no distension.  Musculoskeletal: She exhibits no edema or deformity.  Lymphadenopathy:    She has cervical adenopathy (Minimal on right side. ).  Neurological: She is alert. She exhibits normal muscle tone.  Skin: Skin is warm and dry. She is not diaphoretic.  Psychiatric: She has a normal mood and affect. Her behavior is normal.  Nursing note and vitals reviewed.    ED Treatments / Results  Labs (all labs ordered are listed, but only abnormal results are displayed) Labs Reviewed  BASIC METABOLIC PANEL - Abnormal; Notable for the following components:      Result Value   Glucose, Bld 108 (*)    All other components within normal limits  RAPID STREP SCREEN (NOT [facility])  CULTURE, GROUP A STREP ([facility])  CBC WITH DIFFERENTIAL/PLATELET  I-STAT BETA HCG BLOOD, ED ([facility], [facility], [facility] ONLY)    EKG None  Radiology Ct Soft Tissue Neck W Contrast  Result Date: 01/13/2018 CLINICAL DATA:  RIGHT-sided face, jaw, neck pain for 6 days progressively worsening, worse with mouth movements and  swallowing. EXAM: CT NECK WITH CONTRAST TECHNIQUE: Multidetector CT imaging of the neck was performed using the standard protocol following the bolus administration of intravenous contrast. CONTRAST:  75mL ISOVUE-300 IOPAMIDOL (ISOVUE-300) INJECTION 61% COMPARISON:  Neck radiographs May 09, 2013 FINDINGS: PHARYNX AND LARYNX: Normal.  Widely patent airway. SALIVARY GLANDS: Normal. THYROID: Normal. LYMPH NODES: No lymphadenopathy by CT size criteria. VASCULAR: Normal. LIMITED INTRACRANIAL: Normal. VISUALIZED ORBITS: Normal. MASTOIDS AND VISUALIZED PARANASAL SINUSES: Mildly lobulated maxillary sinus mucosal thickening. Imaged mastoid air cells are well aerated. SKELETON: Nonacute. No CT findings of temporomandibular osteoarthrosis. No CT findings of acute intra pathology. UPPER CHEST: Lung apices are clear. No superior mediastinal lymphadenopathy. OTHER: None. IMPRESSION: Normal contrast-enhanced CT neck. Electronically Signed   By: Awilda Metroourtnay  Bloomer M.D.   On: 01/13/2018 21:11  Procedures Procedures (including critical care time)  Medications Ordered in ED Medications  iopamidol (ISOVUE-300) 61 % injection (has no administration in time range)  iopamidol (ISOVUE-300) 61 % injection (has no administration in time range)  iopamidol (ISOVUE-300) 61 % injection 100 mL (75 mLs Intravenous Contrast Given 01/13/18 1808)     Initial Impression / Assessment and Plan / ED Course  I have reviewed the triage vital signs and the nursing notes.  Pertinent labs & imaging results that were available during my care of the patient were reviewed by me and considered in my medical decision making (see chart for details).  Clinical Course as of Jan 14 2240  Mon Jan 13, 2018  1925 Awaiting CT scan results   [EH]  2055 Called [place] rad regard   [EH]  2136 Patient is requesting her IV be taken out so she can leave.    [EH]    Clinical Course User Index [EH] Cristina Gong, PA-C   Beth Russell  presents today for evaluation of jaw and facial pain.  She has been having this pain for multiple weeks.  She was referred to the dental clinic.  Based on trismus CT neck was ordered which did not have any acute abnormalities or cause for her pain found.  Labs were obtained and reviewed, rapid strep negative, strep culture pending.  No leukocytosis, electrolytes are without abnormalities.  Bilateral serous effusions consistent with allergies.  Patient referred to ophthalmology and ENT.  Patient given return precautions and stated her understanding.  No dental abscess or obvious intra-oral infections.   Final Clinical Impressions(s) / ED Diagnoses   Final diagnoses:  Jaw pain  Ear pain, right    ED Discharge Orders    None       Norman Clay 01/13/18 2241    Tilden Fossa, MD 01/13/18 2255

## 2018-01-13 NOTE — ED Notes (Signed)
Pt called for CT, no response.

## 2018-01-13 NOTE — ED Notes (Signed)
Pt just came back to waiting room CT called to let them know

## 2018-01-13 NOTE — ED Provider Notes (Signed)
Patient placed in Quick Look pathway, seen and evaluated   Chief Complaint: Jaw, face and neck pain  HPI:   32 y.o. F who presents for evaluation of 6 days of right-sided face, jaw, neck pain.  Patient reports that pain has progressively worsened.  Pain is worse with opening and closing her mouth and with swallowing.  Patient states that she feels the pain is in the back of the right side of jaw and spread up her face and down into her neck.  Patient feels like she has had some facial swelling but denies any redness or warmth.  Patient denies any fever.  Patient reports that she was recently treated for strep throat and in urgent care a few weeks ago.  Patient states she was given antibiotics which she states she completed.  ROS: Jaw, Face and neck pain  Physical Exam:   Gen: No distress  Eyes: PERRL, EOMS intact without difficulty.  no   Neck: Neck appears slightly asymmetric with very slight anterior neck swelling noted to the right side.   Neuro: Awake and Alert  Pulm: No evidence of respiratory distress. Able to speak in full sentences without difficulty.  Skin: Warm    Focused Exam: HENT: +Trismus, Posterior oropharynx is erythematous.     Initiation of care has begun. The patient has been counseled on the process, plan, and necessity for staying for the completion/evaluation, and the remainder of the medical screening examination    Maxwell CaulLayden, Lindsey A, PA-C 01/13/18 1458    Melene PlanFloyd, Dan, DO 01/13/18 1527

## 2018-01-13 NOTE — ED Triage Notes (Addendum)
Per translation line-reports pain to jaw on right side, right facial pain x 6 days. It may be her tooth, and is burning sensation. Denies fever, but pain is getting worse. Reports swelling to right side of face. Feels like her right eye is cloudy. Recently given antibiotic for her throat 1 month ago. She becomes tearful when she has to open her mouth wide.

## 2018-01-13 NOTE — ED Notes (Signed)
Pt verbalized understanding discharge instructions and denies any further needs or questions at this time. VS stable, ambulatory and steady gait.   

## 2018-01-13 NOTE — Discharge Instructions (Addendum)
Please consider taking a daily allergy medication to help with your symptoms.  I suggest a less drowsy 24 hour medication such as allegra, zyrtec or Claritin or the generic version.  I also suggest you start taking flonase or nasacort to help your symptoms.    If your symptoms worsen please seek additional medical evaluation.  I have given you referrals for both the ear nose and throat doctor along with the eye doctor.  Please call them at their numbers and make appointments to be seen.  Please take Ibuprofen (Advil, motrin) and Tylenol (acetaminophen) to relieve your pain.  You may take up to 600 MG (3 pills) of normal strength ibuprofen every 8 hours as needed.  In between doses of ibuprofen you make take tylenol, up to 1,000 mg (two extra strength pills).  Do not take more than 3,000 mg tylenol in a 24 hour period.  Please check all medication labels as many medications such as pain and cold medications may contain tylenol.  Do not drink alcohol while taking these medications.  Do not take other NSAID'S while taking ibuprofen (such as aleve or naproxen).  Please take ibuprofen with food to decrease stomach upset.

## 2018-01-15 LAB — CULTURE, GROUP A STREP (THRC)

## 2018-04-01 ENCOUNTER — Other Ambulatory Visit: Payer: Self-pay

## 2018-04-01 DIAGNOSIS — Z0000 Encounter for general adult medical examination without abnormal findings: Secondary | ICD-10-CM

## 2018-04-02 LAB — LIPID PANEL W/O CHOL/HDL RATIO
CHOLESTEROL TOTAL: 147 mg/dL (ref 100–199)
HDL: 44 mg/dL (ref >39)
LDL Calculated: 71 mg/dL (ref 0–99)
TRIGLYCERIDES: 162 mg/dL — AB (ref 0–149)
VLDL CHOLESTEROL CAL: 32 mg/dL (ref 5–40)

## 2018-04-02 LAB — CBC WITH DIFFERENTIAL/PLATELET
BASOS: 1 %
Basophils Absolute: 0 10*3/uL (ref 0.0–0.2)
EOS (ABSOLUTE): 0.2 10*3/uL (ref 0.0–0.4)
Eos: 3 %
HEMOGLOBIN: 11.9 g/dL (ref 11.1–15.9)
Hematocrit: 36.8 % (ref 34.0–46.6)
IMMATURE GRANS (ABS): 0 10*3/uL (ref 0.0–0.1)
Immature Granulocytes: 0 %
Lymphocytes Absolute: 1.4 10*3/uL (ref 0.7–3.1)
Lymphs: 29 %
MCH: 29 pg (ref 26.6–33.0)
MCHC: 32.3 g/dL (ref 31.5–35.7)
MCV: 90 fL (ref 79–97)
Monocytes Absolute: 0.4 10*3/uL (ref 0.1–0.9)
Monocytes: 9 %
NEUTROS ABS: 3 10*3/uL (ref 1.4–7.0)
Neutrophils: 58 %
Platelets: 242 10*3/uL (ref 150–450)
RBC: 4.11 x10E6/uL (ref 3.77–5.28)
RDW: 14.5 % (ref 12.3–15.4)
WBC: 5.1 10*3/uL (ref 3.4–10.8)

## 2018-04-02 LAB — COMPREHENSIVE METABOLIC PANEL
A/G RATIO: 1.6 (ref 1.2–2.2)
ALBUMIN: 4.5 g/dL (ref 3.5–5.5)
ALT: 20 IU/L (ref 0–32)
AST: 19 IU/L (ref 0–40)
Alkaline Phosphatase: 63 IU/L (ref 39–117)
BILIRUBIN TOTAL: 0.5 mg/dL (ref 0.0–1.2)
BUN/Creatinine Ratio: 13 (ref 9–23)
BUN: 8 mg/dL (ref 6–20)
CO2: 24 mmol/L (ref 20–29)
Calcium: 9.2 mg/dL (ref 8.7–10.2)
Chloride: 105 mmol/L (ref 96–106)
Creatinine, Ser: 0.64 mg/dL (ref 0.57–1.00)
GFR calc non Af Amer: 119 mL/min/{1.73_m2} (ref >59)
GFR, EST AFRICAN AMERICAN: 138 mL/min/{1.73_m2} (ref >59)
GLUCOSE: 89 mg/dL (ref 65–99)
Globulin, Total: 2.8 g/dL (ref 1.5–4.5)
POTASSIUM: 4 mmol/L (ref 3.5–5.2)
SODIUM: 142 mmol/L (ref 134–144)
TOTAL PROTEIN: 7.3 g/dL (ref 6.0–8.5)

## 2018-04-03 ENCOUNTER — Encounter: Payer: Self-pay | Admitting: Internal Medicine

## 2018-04-03 ENCOUNTER — Ambulatory Visit: Payer: Self-pay | Admitting: Internal Medicine

## 2018-04-03 VITALS — BP 112/72 | HR 76 | Resp 12 | Ht 60.0 in | Wt 138.0 lb

## 2018-04-03 DIAGNOSIS — Z124 Encounter for screening for malignant neoplasm of cervix: Secondary | ICD-10-CM

## 2018-04-03 DIAGNOSIS — Z0000 Encounter for general adult medical examination without abnormal findings: Secondary | ICD-10-CM

## 2018-04-03 DIAGNOSIS — M546 Pain in thoracic spine: Secondary | ICD-10-CM

## 2018-04-03 LAB — POCT WET PREP WITH KOH
Clue Cells Wet Prep HPF POC: NEGATIVE
KOH Prep POC: NEGATIVE
RBC WET PREP PER HPF POC: NEGATIVE
Trichomonas, UA: NEGATIVE
YEAST WET PREP PER HPF POC: NEGATIVE

## 2018-04-03 LAB — POCT URINALYSIS DIPSTICK
BILIRUBIN UA: NEGATIVE
GLUCOSE UA: NEGATIVE
Nitrite, UA: NEGATIVE
Protein, UA: NEGATIVE
Spec Grav, UA: 1.02 (ref 1.010–1.025)
UROBILINOGEN UA: 0.2 U/dL
pH, UA: 5 (ref 5.0–8.0)

## 2018-04-03 NOTE — Progress Notes (Signed)
Subjective:    Patient ID: Beth Russell, female    DOB: 1986/07/26, 32 y.o.   MRN: [id number]  HPI   CPE with pap  1.  Pap:  Last pap 3 years ago.  Always normal.  Her sister was 62 yo with cervical cancer.  She is currently undergoing treatment.  2.  Mammogram:  Never.  No family history.    3.  Osteoprevention:  Drinks milk once daily.  Is physically active with housework and shopping. Does not have focused physical activity daily.  4.  Guaiac Cards:  Never.  Maternal uncle being treated for colon cancer around age 32 yo.  5.  Colonoscopy:  Never.  6.  Immunizations:   Immunization History  Administered Date(s) Administered  . Influenza,inj,Quad PF,6+ Mos 06/30/2014  . Tdap 10/11/2011, 07/28/2014    7.  Glucose/Cholesterol:  Fasting blood glucose normal with labs recently.  Cholesterol was okay, but discussed low HDL and high Trigs. Lipid Panel     Component Value Date/Time   CHOL 147 04/01/2018 0904   TRIG 162 (H) 04/01/2018 0904   HDL 44 04/01/2018 0904   LDLCALC 71 04/01/2018 0904   No outpatient medications have been marked as taking for the 04/03/18 encounter (Office Visit) with Julieanne Manson, MD.    No Known Allergies   Past Medical History:  Diagnosis Date  . History of sickle cell trait 08/25/2011    Past Surgical History:  Procedure Laterality Date  . DILATION AND CURETTAGE OF UTERUS N/A 10/06/2014   Procedure: SUCTION DILATATION AND CURETTAGE / BAKRI BALLOON PLACEMENT / REPAIR OF VAGINAL LACERATION;  Surgeon: Lesly Dukes, MD;  Location: [facility];  Service: Gynecology;  Laterality: N/A;    Family History  Problem Relation Age of Onset  . Cancer Sister        cervical  . Cancer Maternal Uncle        colon    Social History   Socioeconomic History  . Marital status: Married    Spouse name: Theotis Barrio  . Number of children: 3  . Years of education: 9  . Highest education level: Not on file  Occupational History  . 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 Financial resource strain: Not very hard  . Food insecurity:    Worry: Never true    Inability: Never true  . Transportation needs:    Medical: No    Non-medical: No  Tobacco Use  . Smoking status: Never Smoker  . Smokeless tobacco: Never Used  Substance and Sexual Activity  . Alcohol use: No  . Drug use: No  . Sexual activity: Yes    Birth control/protection: Condom  Lifestyle  . Physical activity:    Days per week: Not on file    Minutes per session: Not on file  . Stress: Not on file  Relationships  . Social connections:    Talks on phone: Not on file    Gets together: Not on file    Attends religious service: Not on file    Active member of club or organization: Not on file    Attends meetings of clubs or organizations: Not on file    Relationship status: Not on file  . Intimate partner violence:    Fear of current or ex partner: No    Emotionally abused: No    Physically abused: No    Forced sexual activity: No  Other Topics Concern  . Not on file  Social History Narrative  Lives at home with husband and their 3 children      Review of Systems  Constitutional: Positive for fatigue. Negative for appetite change, fever and unexpected weight change.  HENT: Positive for dental problem (teeth in general hurt with cold drinks.), ear pain (right ear pain on and off) and sore throat (when right ear hurts). Negative for hearing loss.   Eyes: Negative for visual disturbance.  Respiratory: Positive for cough (more at night. for about 2 weeks.  No fever.  Productive of yellow mucous). Negative for shortness of breath.   Cardiovascular: Positive for leg swelling. Negative for chest pain and palpitations.  Gastrointestinal: Positive for abdominal pain (twice weekly.  generally after eating breakfast.), diarrhea (when she is having abdominal pain above, she will have the diarrhea) and nausea (yesterday after drinking coffee). Negative for  blood in stool.  Genitourinary: Negative for dysuria.  Musculoskeletal: Positive for back pain (Has had for 3 weeks.  MId to low thoracic.  Hurts when her children sit on her lap and she is holding them in church.  Massage helps a bit. Changing positions helps.  No numbness or tingling in feet.).  Psychiatric/Behavioral: Negative for dysphoric mood. The patient is not nervous/anxious.        Objective:   Physical Exam  Constitutional: She is oriented to person, place, and time. She appears well-developed and well-nourished.  HENT:  Head: Normocephalic and atraumatic.  Right Ear: Hearing, tympanic membrane, external ear and ear canal normal.  Left Ear: Hearing, tympanic membrane, external ear and ear canal normal.  Nose: Nose normal.  Mouth/Throat: Uvula is midline, oropharynx is clear and moist and mucous membranes are normal.  Eyes: Pupils are equal, round, and reactive to light. Conjunctivae and EOM are normal.  Discs sharp bilaterally.  Neck: Normal range of motion. Neck supple. No thyromegaly present.  Cardiovascular: Normal rate, regular rhythm, S1 normal and S2 normal. Exam reveals no S3, no S4 and no friction rub.  No murmur heard. Carotid, radial, femoral, DP and PT pulses normal and equal. No LE edema Varicosities, especially left lateral high ankle area.  Pulmonary/Chest: Effort normal and breath sounds normal. Right breast exhibits no inverted nipple, no mass, no nipple discharge, no skin change and no tenderness. Left breast exhibits no inverted nipple, no mass, no nipple discharge, no skin change and no tenderness.  Abdominal: Soft. Bowel sounds are normal. She exhibits no mass. There is no hepatosplenomegaly. There is no tenderness. No hernia.  Genitourinary:  Genitourinary Comments: Normal external female genitalia.  No discharge. No vaginal or cervical lesions. No uterine or adnexal mass or tenderness.  Musculoskeletal: Normal range of motion.  Tender over  traps/paraspinous cervical and thoracic musculature.  Poor posture with core slumped forward and neck hyperextended.  Lymphadenopathy:       Head (right side): No submental and no submandibular adenopathy present.       Head (left side): No submental and no submandibular adenopathy present.    She has no cervical adenopathy.    She has no axillary adenopathy.       Right: No inguinal and no supraclavicular adenopathy present.       Left: No inguinal and no supraclavicular adenopathy present.  Neurological: She is alert and oriented to person, place, and time. She has normal strength and normal reflexes. No cranial nerve deficit or sensory deficit. She exhibits normal muscle tone. Coordination and gait normal.  Skin: Skin is warm. Capillary refill takes less than 2 seconds. No lesion  and no rash noted.  Psychiatric: She has a normal mood and affect. Her speech is normal and behavior is normal. Judgment and thought content normal. Cognition and memory are normal.            Assessment & Plan:  1.  CPE with pap  2.  Neck and thoracic back pain:  PT referral.  Discussed improved posture as well.

## 2018-04-03 NOTE — Patient Instructions (Signed)
Sensodyne tooth paste

## 2018-04-04 LAB — CYTOLOGY - PAP

## 2018-07-04 ENCOUNTER — Ambulatory Visit: Payer: Self-pay | Admitting: Internal Medicine

## 2019-06-28 IMAGING — US US OB TRANSVAGINAL
1 series · 13 of 28 positions shown · non-contrast
Comparison: None.

CLINICAL DATA: Abdominal pain and vaginal bleeding today. Estimated
gestational age by LMP is 11 weeks 4 days. Quantitative beta HCG is
2,779

EXAM:
OBSTETRIC <14 WK US AND TRANSVAGINAL OB US
TECHNIQUE: Both transabdominal and transvaginal ultrasound examinations were
performed for complete evaluation of the gestation as well as the
maternal uterus, adnexal regions, and pelvic cul-de-sac.
Transvaginal technique was performed to assess early pregnancy.

[Series 1: us ob transvaginal · 0.23mm/px · 65 acquisitions, 13 frames shown]
[im 3/65]
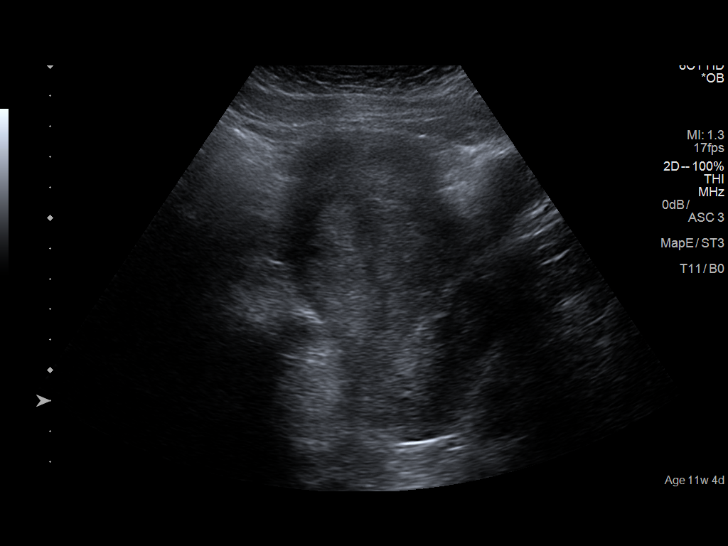
[im 8/65]
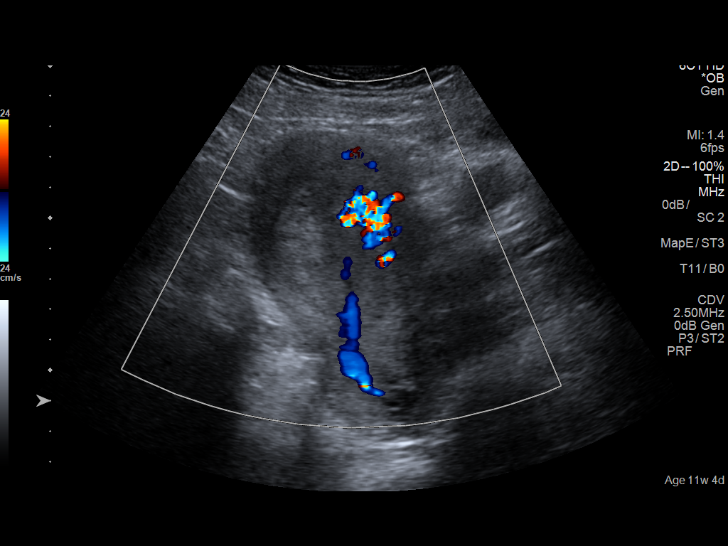
[im 12/65]
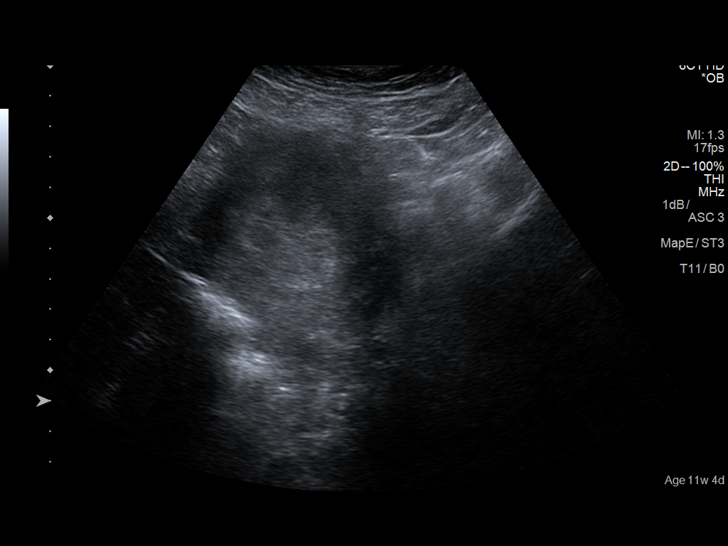
[im 17/65]
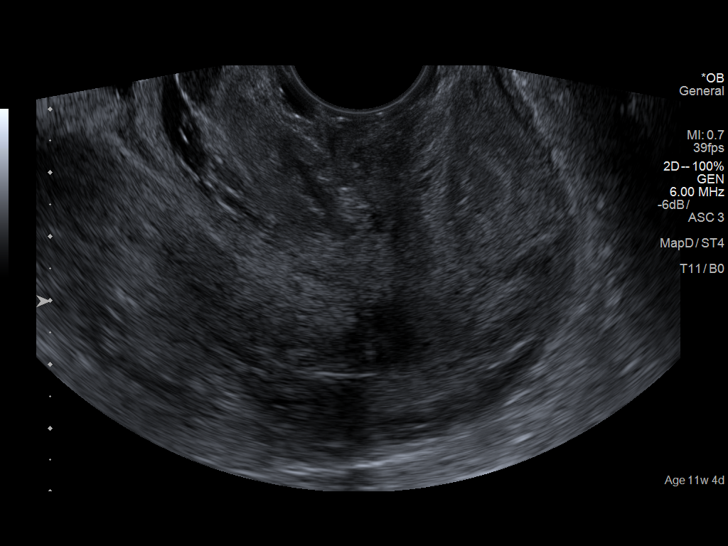
[im 22/65]
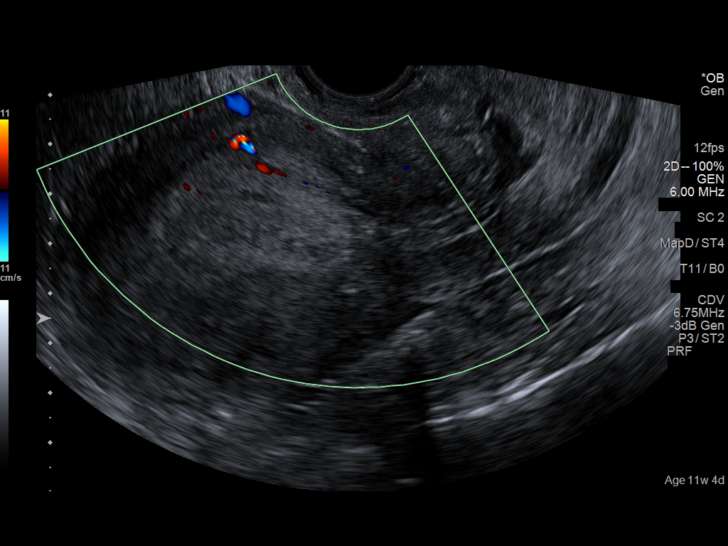
[im 27/65]
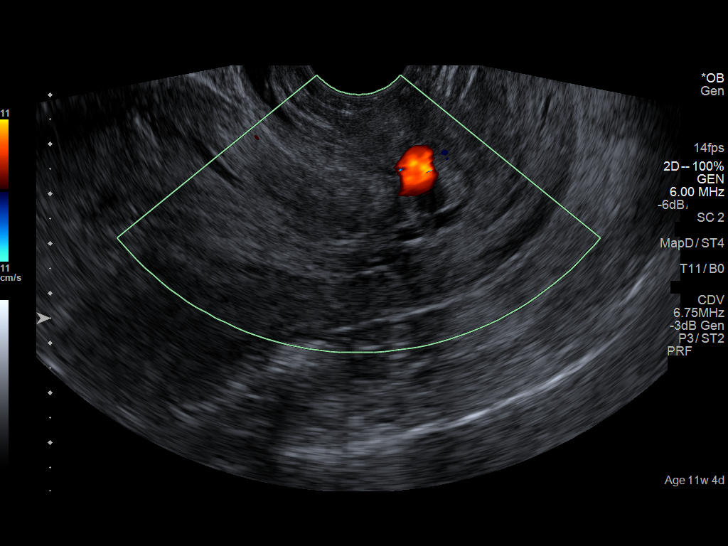
[im 34/65]
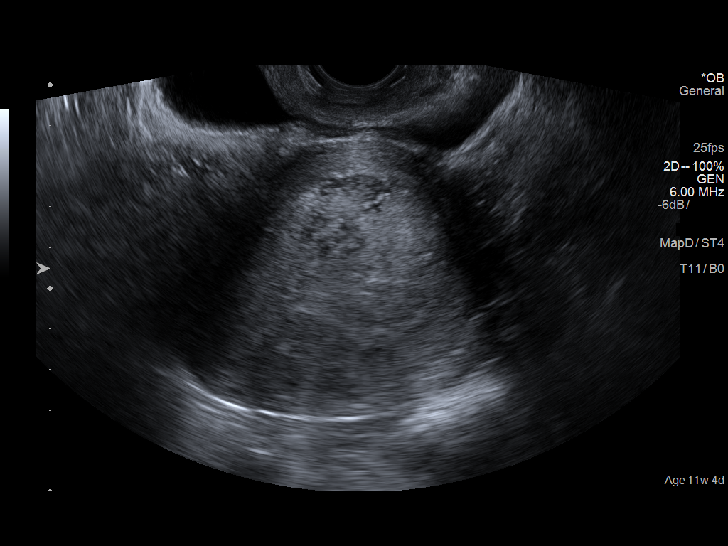
[im 38/65]
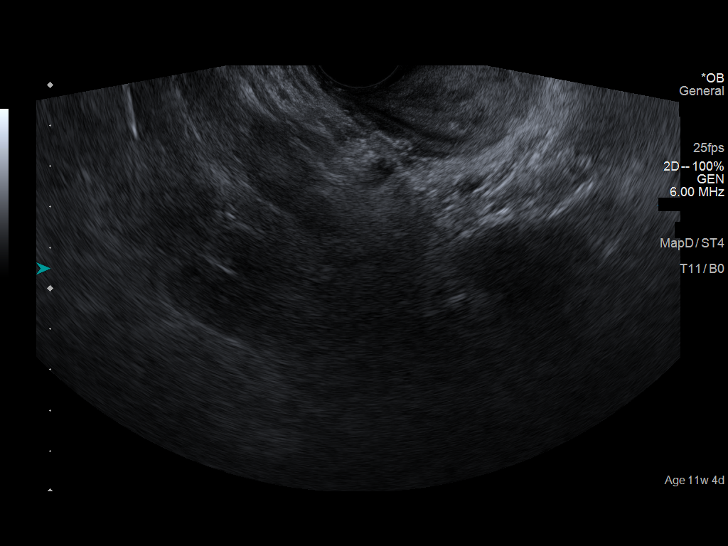
[im 43/65]
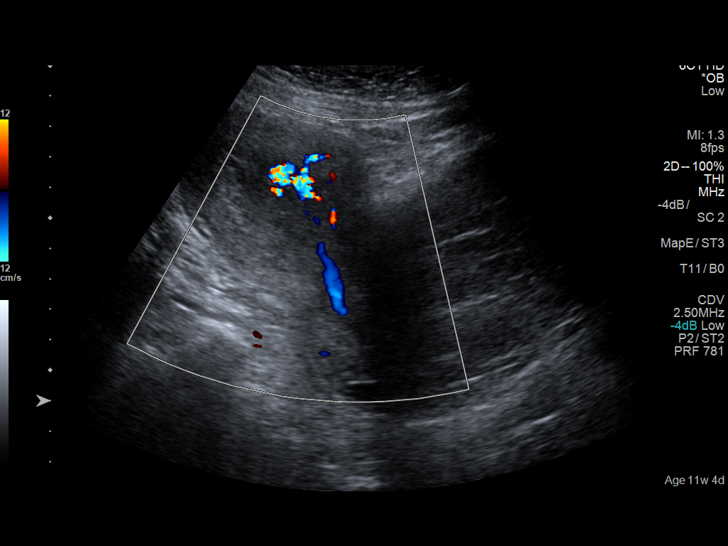
[im 48/65]
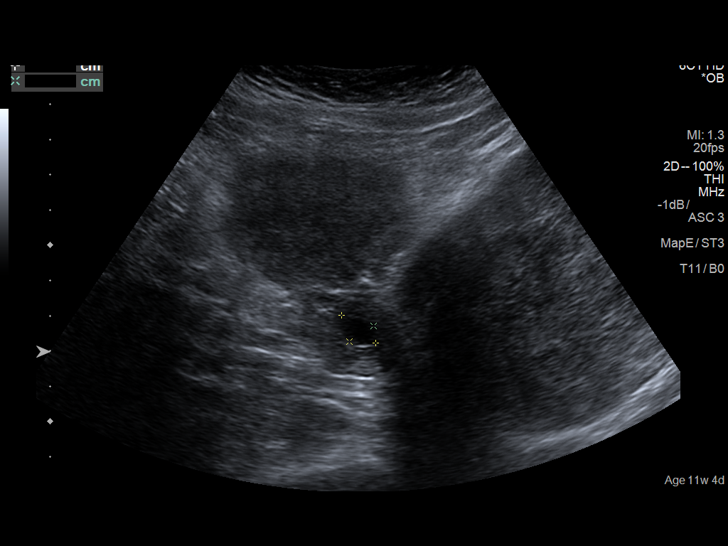
[im 53/65]
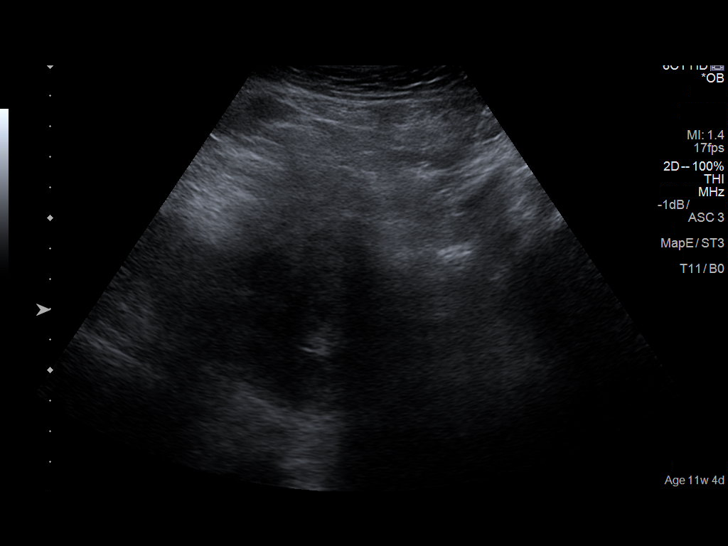
[im 57/65]
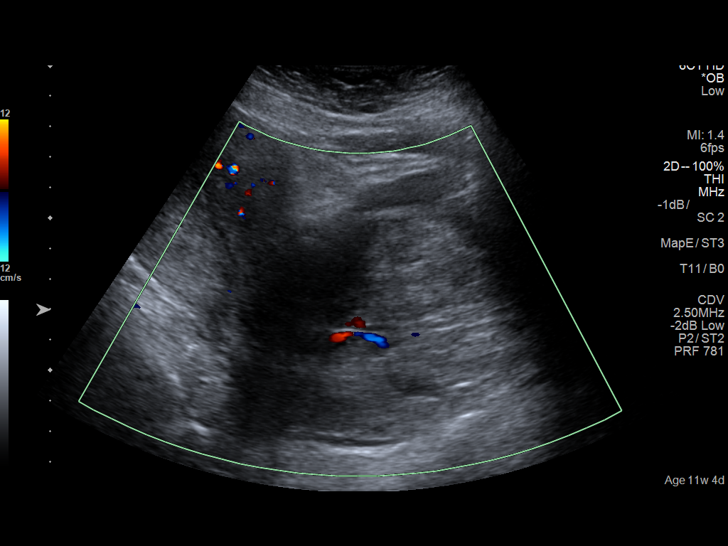
[im 62/65]
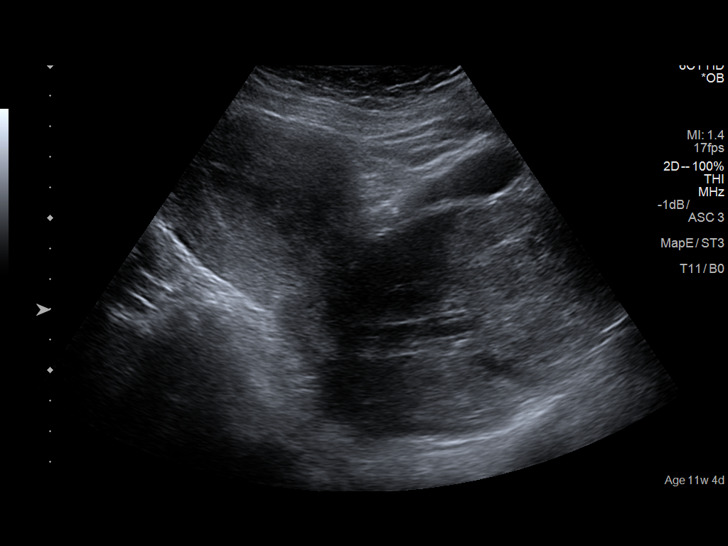

[13 of 28 positions shown; findings below may reference images not displayed]

FINDINGS: Intrauterine gestational sac: No intrauterine gestational sac is
identified.

Yolk sac:  Not Visualized.

Embryo:  Not Visualized.

Cardiac Activity: Not Visualized.

Maternal uterus/adnexae: The uterus is anteverted. No myometrial
mass lesions are identified. There is diffuse heterogeneous
expansion of the endometrium with endometrial thickness measuring
2.6 cm. Endometrial heterogeneous echogenic material expands into
the lower uterine segment, endocervical region, and likely into the
vagina with lower uterine segment measuring 6.8 cm in thickness.
Flow is demonstrated within the endometrial contents on color flow
Doppler imaging.

Right ovary is visualized and measures 2.5 x 2 x 1.9 cm. Corpus
luteal cyst is demonstrated. No abnormal adnexal masses.

Left ovary is not visualized.

No free fluid in the pelvis.
IMPRESSION: 1. No intrauterine pregnancy is demonstrated.
2. Extensive heterogeneous endometrial thickening with flow in the
endometrial contents. Changes consistent with spontaneous abortion
in progress. Likely retained products of conception within the
endometrium.
3. Since no definitive intrauterine pregnancy is identified, ectopic
pregnancy is not entirely excluded but felt to be unlikely in this
setting.
4. Further management should be based on the clinical evaluation. If
clinically indicated, a follow up examination 6-8 weeks may be
useful or earlier follow-up if bleeding continues.

## 2019-08-03 LAB — OB RESULTS CONSOLE VARICELLA ZOSTER ANTIBODY, IGG: Varicella: IMMUNE

## 2019-08-03 LAB — OB RESULTS CONSOLE HIV ANTIBODY (ROUTINE TESTING)
HIV: NONREACTIVE
HIV: NONREACTIVE

## 2019-08-03 LAB — OB RESULTS CONSOLE RPR
RPR: NONREACTIVE
RPR: NONREACTIVE

## 2019-08-03 LAB — OB RESULTS CONSOLE HEPATITIS B SURFACE ANTIGEN
Hepatitis B Surface Ag: NEGATIVE
Hepatitis B Surface Ag: NEGATIVE

## 2019-08-03 LAB — OB RESULTS CONSOLE GC/CHLAMYDIA
Chlamydia: NEGATIVE
Gonorrhea: NEGATIVE

## 2019-08-03 LAB — OB RESULTS CONSOLE RUBELLA ANTIBODY, IGM: Rubella: IMMUNE

## 2019-10-09 NOTE — L&D Delivery Note (Signed)
OB/GYN Faculty Practice Delivery Note  Beth Russell is a 34 y.o. [id number] s/p VD at [redacted]w[redacted]d. She was admitted for SOL.   ROM: 0h 26m with clear fluid GBS Status: Negative/-- (03/18 0000) Maximum Maternal Temperature: 97.19F  Labor Progress: . Initial SVE: 5.5/80/-1. Patient quickly progressed to complete upon arrival to L&D and delivered en caul.   Delivery Date/Time: 4/11 @ [phone] Delivery: Called to room as patient was progressing quickly. She began involuntarily pushing. Head delivered ROA en caul. Loose nuchal cord present and reduced after delivery. Shoulder and body delivered in usual fashion. Infant with spontaneous cry, placed on mother's abdomen, dried and stimulated. Cord clamped x 2 after 1-minute delay, and cut by FOB. Cord blood drawn. Placenta delivered spontaneously with gentle cord traction. TXA given upon arrival to L&D due to hx of PPH. Fundus firm with massage and Pitocin but then would loose tone without massage. Methergine given. Labia, perineum, vagina, and cervix inspected inspected with no lacerations.  Baby Weight: pending  Placenta: Sent to L&D Complications: None Lacerations: None EBL: 350 mL Analgesia: None   Infant:  APGAR (1 MIN): 8   APGAR (5 MINS): 9   APGAR (10 MINS):     Jerilynn Birkenhead, MD [facility] Fellow, [facility] for [facility], [facility] 01/17/2020, 7:41 AM

## 2019-12-24 LAB — OB RESULTS CONSOLE GBS: GBS: NEGATIVE

## 2020-01-15 ENCOUNTER — Inpatient Hospital Stay (HOSPITAL_COMMUNITY)
Admission: AD | Admit: 2020-01-15 | Discharge: 2020-01-16 | Disposition: A | Discharge status: Home / Self Care | Payer: Medicaid Other | Attending: Obstetrics & Gynecology | Admitting: Obstetrics & Gynecology

## 2020-01-15 ENCOUNTER — Encounter (HOSPITAL_COMMUNITY): Payer: Self-pay | Admitting: *Deleted

## 2020-01-15 DIAGNOSIS — O479 False labor, unspecified: Secondary | ICD-10-CM

## 2020-01-15 DIAGNOSIS — Z3A00 Weeks of gestation of pregnancy not specified: Secondary | ICD-10-CM | POA: Insufficient documentation

## 2020-01-15 NOTE — MAU Note (Signed)
Pt reports to MAU c/o "strong pain that radiates around my belly and it goes to the back" that started around 0400 and it comes and goes. Pt states pain is 9/10. Pt denies bleeding and LOF. Pt reports swelling in hands and feet that will not go away that began 2 weeks ago. Pt reports FM but does not believe that it is 10 kicks within 2 hours.

## 2020-01-16 DIAGNOSIS — O471 False labor at or after 37 completed weeks of gestation: Secondary | ICD-10-CM | POA: Diagnosis not present

## 2020-01-16 DIAGNOSIS — Z3A39 39 weeks gestation of pregnancy: Secondary | ICD-10-CM | POA: Diagnosis not present

## 2020-01-16 MED ORDER — OXYCODONE HCL 5 MG PO TABS
5.0000 mg | ORAL_TABLET | Freq: Once | ORAL | Status: AC
  Administered 2020-01-16: 02:00:00 5 mg via ORAL
  Filled 2020-01-16: qty 1

## 2020-01-16 NOTE — Progress Notes (Signed)
  S: Ms. Beth Russell is a 34 y.o. [phone] at [redacted]w[redacted]d  who presents to MAU today complaining contractions since 0400. She denies vaginal bleeding. She denies LOF. She reports normal fetal movement.    O: BP 119/77   Pulse 82   Temp 98.4 F (36.9 C) (Oral)   Resp 19   Cervical exam:  Dilation: 1 Effacement (%): Thick Cervical Position: Posterior Station: -3 Presentation: Vertex Exam by:: Manuela Neptune, RN   Fetal Monitoring: Baseline: 130 Variability: moderate Accelerations: pos Decelerations: absent Contractions: infrequent  A: SIUP at [redacted]w[redacted]d  False labor vs latent labor Reactive NST Vitals stable  P: Discharge home with return precautions F/u at HD  Joselyn Arrow, MD 01/16/2020 2:10 AM

## 2020-01-16 NOTE — MAU Note (Signed)
I have communicated with Jerilynn Birkenhead, MD and reviewed vital signs:  Vitals:   01/16/20 0116 01/16/20 0202  BP: 119/77 115/89  Pulse: 82 84  Resp:    Temp:      Vaginal exam:  Dilation: 1 Effacement (%): Thick Cervical Position: Posterior Station: -3 Presentation: Vertex Exam by:: Manuela Neptune, RN,   Also reviewed contraction pattern and that non-stress test is reactive.  It has been documented that patient is having uterine irritability with occasional contractions with no cervical change over 1 hours not indicating active labor.  Patient denies any other complaints.  Based on this report provider has given order for discharge.  A discharge order and diagnosis entered by a provider.   Labor discharge instructions reviewed with patient.

## 2020-01-17 ENCOUNTER — Other Ambulatory Visit: Payer: Self-pay

## 2020-01-17 ENCOUNTER — Inpatient Hospital Stay (HOSPITAL_COMMUNITY)
Admission: AD | Admit: 2020-01-17 | Discharge: 2020-01-18 | DRG: 807 | Disposition: A | Discharge status: Home / Self Care | Payer: Medicaid Other | Attending: Obstetrics and Gynecology | Admitting: Obstetrics and Gynecology

## 2020-01-17 ENCOUNTER — Encounter (HOSPITAL_COMMUNITY): Payer: Self-pay | Admitting: Obstetrics and Gynecology

## 2020-01-17 DIAGNOSIS — O99013 Anemia complicating pregnancy, third trimester: Secondary | ICD-10-CM

## 2020-01-17 DIAGNOSIS — Z3A39 39 weeks gestation of pregnancy: Secondary | ICD-10-CM | POA: Diagnosis not present

## 2020-01-17 DIAGNOSIS — Z603 Acculturation difficulty: Secondary | ICD-10-CM

## 2020-01-17 DIAGNOSIS — D573 Sickle-cell trait: Secondary | ICD-10-CM | POA: Diagnosis present

## 2020-01-17 DIAGNOSIS — O9902 Anemia complicating childbirth: Secondary | ICD-10-CM | POA: Diagnosis present

## 2020-01-17 DIAGNOSIS — Z20822 Contact with and (suspected) exposure to covid-19: Secondary | ICD-10-CM | POA: Diagnosis present

## 2020-01-17 DIAGNOSIS — O26893 Other specified pregnancy related conditions, third trimester: Secondary | ICD-10-CM | POA: Diagnosis present

## 2020-01-17 DIAGNOSIS — Z862 Personal history of diseases of the blood and blood-forming organs and certain disorders involving the immune mechanism: Secondary | ICD-10-CM

## 2020-01-17 LAB — CBC
HCT: 40.8 % (ref 36.0–46.0)
Hemoglobin: 13.9 g/dL (ref 12.0–15.0)
MCH: 32.4 pg (ref 26.0–34.0)
MCHC: 34.1 g/dL (ref 30.0–36.0)
MCV: 95.1 fL (ref 80.0–100.0)
Platelets: 199 10*3/uL (ref 150–400)
RBC: 4.29 MIL/uL (ref 3.87–5.11)
RDW: 13.3 % (ref 11.5–15.5)
WBC: 8.3 10*3/uL (ref 4.0–10.5)
nRBC: 0 % (ref 0.0–0.2)

## 2020-01-17 LAB — RPR: RPR Ser Ql: NONREACTIVE

## 2020-01-17 LAB — RESPIRATORY PANEL BY RT PCR (FLU A&B, COVID)
Influenza A by PCR: NEGATIVE
Influenza B by PCR: NEGATIVE
SARS Coronavirus 2 by RT PCR: NEGATIVE

## 2020-01-17 LAB — TYPE AND SCREEN
ABO/RH(D): O POS
Antibody Screen: NEGATIVE

## 2020-01-17 MED ORDER — COCONUT OIL OIL: 1.0000 "application " | TOPICAL_OIL | Status: DC | PRN

## 2020-01-17 MED ORDER — OXYTOCIN 40 UNITS IN NORMAL SALINE INFUSION - SIMPLE MED
2.5000 [IU]/h | INTRAVENOUS | Status: DC
  Filled 2020-01-17: qty 1000

## 2020-01-17 MED ORDER — DIBUCAINE (PERIANAL) 1 % EX OINT: 1.0000 "application " | TOPICAL_OINTMENT | CUTANEOUS | Status: DC | PRN

## 2020-01-17 MED ORDER — LACTATED RINGERS IV SOLN: 500.0000 mL | INTRAVENOUS | Status: DC | PRN

## 2020-01-17 MED ORDER — BENZOCAINE-MENTHOL 20-0.5 % EX AERO: 1.0000 "application " | INHALATION_SPRAY | CUTANEOUS | Status: DC | PRN

## 2020-01-17 MED ORDER — OXYTOCIN BOLUS FROM INFUSION
500.0000 mL | Freq: Once | INTRAVENOUS | Status: AC
  Administered 2020-01-17: 500 mL via INTRAVENOUS

## 2020-01-17 MED ORDER — SENNOSIDES-DOCUSATE SODIUM 8.6-50 MG PO TABS
2.0000 | ORAL_TABLET | ORAL | Status: DC
  Filled 2020-01-17: qty 2

## 2020-01-17 MED ORDER — SIMETHICONE 80 MG PO CHEW: 80.0000 mg | CHEWABLE_TABLET | ORAL | Status: DC | PRN

## 2020-01-17 MED ORDER — ZOLPIDEM TARTRATE 5 MG PO TABS: 5.0000 mg | ORAL_TABLET | Freq: Every evening | ORAL | Status: DC | PRN

## 2020-01-17 MED ORDER — TRANEXAMIC ACID-NACL 1000-0.7 MG/100ML-% IV SOLN: 1000.0000 mg | INTRAVENOUS | Status: DC

## 2020-01-17 MED ORDER — SOD CITRATE-CITRIC ACID 500-334 MG/5ML PO SOLN: 30.0000 mL | ORAL | Status: DC | PRN

## 2020-01-17 MED ORDER — DIPHENHYDRAMINE HCL 25 MG PO CAPS: 25.0000 mg | ORAL_CAPSULE | Freq: Four times a day (QID) | ORAL | Status: DC | PRN

## 2020-01-17 MED ORDER — ONDANSETRON HCL 4 MG/2ML IJ SOLN: 4.0000 mg | INTRAMUSCULAR | Status: DC | PRN

## 2020-01-17 MED ORDER — ACETAMINOPHEN 325 MG PO TABS: 650.0000 mg | ORAL_TABLET | ORAL | Status: DC | PRN

## 2020-01-17 MED ORDER — FENTANYL CITRATE (PF) 100 MCG/2ML IJ SOLN
100.0000 ug | INTRAMUSCULAR | Status: DC | PRN
  Administered 2020-01-17: 100 ug via INTRAVENOUS
  Filled 2020-01-17: qty 2

## 2020-01-17 MED ORDER — TETANUS-DIPHTH-ACELL PERTUSSIS 5-2.5-18.5 LF-MCG/0.5 IM SUSP: 0.5000 mL | Freq: Once | INTRAMUSCULAR | Status: DC

## 2020-01-17 MED ORDER — METHYLERGONOVINE MALEATE 0.2 MG/ML IJ SOLN
INTRAMUSCULAR | Status: AC
  Filled 2020-01-17: qty 1

## 2020-01-17 MED ORDER — METHYLERGONOVINE MALEATE 0.2 MG/ML IJ SOLN
0.2000 mg | Freq: Once | INTRAMUSCULAR | Status: AC
  Administered 2020-01-17: 0.2 mg via INTRAMUSCULAR

## 2020-01-17 MED ORDER — LACTATED RINGERS IV SOLN: INTRAVENOUS | Status: DC

## 2020-01-17 MED ORDER — IBUPROFEN 600 MG PO TABS
600.0000 mg | ORAL_TABLET | Freq: Four times a day (QID) | ORAL | Status: DC
  Administered 2020-01-17 – 2020-01-18 (×5): 600 mg via ORAL
  Filled 2020-01-17 (×5): qty 1

## 2020-01-17 MED ORDER — ONDANSETRON HCL 4 MG PO TABS: 4.0000 mg | ORAL_TABLET | ORAL | Status: DC | PRN

## 2020-01-17 MED ORDER — TRANEXAMIC ACID-NACL 1000-0.7 MG/100ML-% IV SOLN
INTRAVENOUS | Status: AC
  Administered 2020-01-17: 1000 mg
  Filled 2020-01-17: qty 100

## 2020-01-17 MED ORDER — ONDANSETRON HCL 4 MG/2ML IJ SOLN: 4.0000 mg | Freq: Four times a day (QID) | INTRAMUSCULAR | Status: DC | PRN

## 2020-01-17 MED ORDER — PRENATAL MULTIVITAMIN CH
1.0000 | ORAL_TABLET | Freq: Every day | ORAL | Status: DC
  Administered 2020-01-17 – 2020-01-18 (×2): 1 via ORAL
  Filled 2020-01-17 (×2): qty 1

## 2020-01-17 MED ORDER — WITCH HAZEL-GLYCERIN EX PADS: 1.0000 "application " | MEDICATED_PAD | CUTANEOUS | Status: DC | PRN

## 2020-01-17 MED ORDER — LIDOCAINE HCL (PF) 1 % IJ SOLN: 30.0000 mL | INTRAMUSCULAR | Status: DC | PRN

## 2020-01-17 NOTE — Discharge Summary (Addendum)
Postpartum Discharge Summary     Patient Name: Beth Russell DOB: [birth year] MRN: [id number]  Date of admission: 01/17/2020 Delivering Provider: Chauncey Mann   Date of discharge: 01/18/2020  Admitting diagnosis: Labor and delivery, indication for care [O75.9] Intrauterine pregnancy: [redacted]w[redacted]d    Secondary diagnosis:  Active Problems:   History of sickle cell trait   Language barrier-spanish speaking only    Labor and delivery, indication for care   [redacted] weeks gestation of pregnancy  Additional problems: None     Discharge diagnosis: Term Pregnancy Delivered                                                                                                Post partum procedures:None  Augmentation: None  Complications: None  Hospital course:  Onset of Labor With Vaginal Delivery     34y.o. yo [id number] [id number] admitted in Latent Labor on 01/17/2020. Patient had an uncomplicated labor course as follows: Initial SVE: 5.5/80/-1. Patient quickly progressed to complete upon arrival to L&D and delivered en caul. Membrane Rupture Time/Date: 7:09 AM ,01/17/2020   Intrapartum Procedures: Episiotomy: None [1]                                         Lacerations:  None [1]  Patient had a delivery of a Viable infant. 01/17/2020  Information for the patient's newborn:  [place], [place][[id number]]Delivery Method: Vag-Spont     Pateint had an uncomplicated postpartum course.  She is ambulating, tolerating a regular diet, passing flatus, and urinating well. Patient is discharged home in stable condition on 01/18/20.  Delivery time: 7:09 AM    Magnesium Sulfate received: No BMZ received: No Rhophylac:No MMR:No Transfusion:No  Physical exam  Vitals:   01/17/20 1427 01/17/20 1910 01/17/20 2316 01/18/20 0500  BP: 113/60 123/73 125/75 95/63  Pulse: 98 80 90 96  Resp: 16 18 18 18   Temp: 98.7 F (37.1 C) 98.3 F (36.8 C) 99.1 F (37.3 C) 98 F (36.7 C)  TempSrc: Oral  Axillary Oral Oral  SpO2: 100%  100% 100%  Weight:      Height:       General: alert, well appearing, no distress  Lochia: appropriate  Uterine Fundus: firm  Incision: NA DVT Evaluation: no evidence of DVT on exam, no calf tenderness or palpable cords     Labs: Lab Results  Component Value Date   WBC 9.0 01/18/2020   HGB 10.0 (L) 01/18/2020   HCT 29.7 (L) 01/18/2020   MCV 97.1 01/18/2020   PLT 175 01/18/2020   CMP Latest Ref Rng & Units 04/01/2018  Glucose 65 - 99 mg/dL 89  BUN 6 - 20 mg/dL 8  Creatinine 0.57 - 1.00 mg/dL 0.64  Sodium 134 - 144 mmol/L 142  Potassium 3.5 - 5.2 mmol/L 4.0  Chloride 96 - 106 mmol/L 105  CO2 20 - 29 mmol/L 24  Calcium 8.7 - 10.2 mg/dL 9.2  Total Protein  6.0 - 8.5 g/dL 7.3  Total Bilirubin 0.0 - 1.2 mg/dL 0.5  Alkaline Phos 39 - 117 IU/L 63  AST 0 - 40 IU/L 19  ALT 0 - 32 IU/L 20   Edinburgh Score: Edinburgh Postnatal Depression Scale Screening Tool 01/18/2020  I have been able to laugh and see the funny side of things. 0  I have looked forward with enjoyment to things. 0  I have blamed myself unnecessarily when things went wrong. 1  I have been anxious or worried for no good reason. 0  I have felt scared or panicky for no good reason. 0  Things have been getting on top of me. 0  I have been so unhappy that I have had difficulty sleeping. 0  I have felt sad or miserable. 0  I have been so unhappy that I have been crying. 0  The thought of harming myself has occurred to me. 0  Edinburgh Postnatal Depression Scale Total 1    Discharge instruction: per After Visit Summary and "Baby and Me Booklet".  After visit meds:  Allergies as of 01/18/2020   No Known Allergies     Medication List    TAKE these medications   prenatal multivitamin Tabs tablet Take 1 tablet by mouth daily at 12 noon.       Diet: routine diet  Activity: Advance as tolerated. Pelvic rest for 6 weeks.   Outpatient follow up:4 weeks Follow up Appt:No future  appointments. Follow up Visit:  Patient to f/u with HD.   Newborn Data: Live born female  Birth Weight: 2990 g APGAR: 82, 9  Newborn Delivery   Birth date/time: 01/17/2020 07:09:00 Delivery type: Vaginal, Spontaneous      Baby Feeding: Breast Disposition:home with mother   01/18/2020 Chauncey Mann, MD

## 2020-01-17 NOTE — Lactation Note (Signed)
This note was copied from a baby's chart. Lactation Consultation Note  Patient Name: Beth Russell [id number] Date: 01/17/2020 Reason for consult: Initial assessment;Term  Visited with mom of a 10 hours old FT female who is being exclusively BF, she's a P4 and experienced BF. She BF her first child for 6 months, her second one for 14 months and her 3rd one for almost 2 years. Mom's choice on admission was to do both, breast and formula, but she'd like to do more breastmilk while in the hospital, praised her for her efforts.   She participated in the [facility] program at the [facility] and she's already familiar with hand expression. When reviewing hand expression with mom, colostrum was easily expressed. Mom told LC that baby has been spitty and very sleepy, and that he hasn't feed since 1:05 pm.   Offered assistance with latch and mom agreed to wake him up to feed, baby had a small spit up and a stool prior feeding attempt. Baby would only do a few sucks at the breast and they fall asleep, he was spitty and not ready to eat.  Feeding attempt was documented in flowsheets. Asked mom to call for assistance when needed.   Reviewed normal newborn behavior, feeding cues and cluster feeding. LC assisted mom with hand expression and spoon feeding and baby took 1 ml of EBM, mom was very pleased.  Feeding plan:  1. Encouraged mom to feed baby STS 8-12 times/24 hours or sooner if feeding cues are present 2. Hand expression and spoon feeding were also encouraged  BF brochure (SP), BF resources (SP) and feeding diary (SP) were reviewed. Mom didn't have a support person in the room at the time of [facility] consultation. She reported all questions and concerns were answered, she's aware of LC OP services and will call PRN.   Maternal Data Formula Feeding for Exclusion: Yes Reason for exclusion: Mother's choice to formula and breast feed on admission Has patient been taught Hand Expression?: Yes Does the patient have  breastfeeding experience prior to this delivery?: Yes  Feeding Feeding Type: Breast Milk  LATCH Score                   Interventions Interventions: Breast feeding basics reviewed;Assisted with latch;Skin to skin;Breast massage;Hand express;Breast compression;Adjust position;Support pillows  Lactation Tools Discussed/Used WIC Program: Yes   Consult Status Consult Status: Follow-up Date: 01/18/20 Follow-up type: In-patient    Legend Tumminello Venetia Constable 01/17/2020, 5:10 PM

## 2020-01-17 NOTE — MAU Note (Signed)
Pt reports to MAU for CTX that started at 4am pt reports some VB but denies LOF, pt endorsers fetal movement.
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
## 2020-01-18 LAB — CBC
HCT: 29.7 % — ABNORMAL LOW (ref 36.0–46.0)
Hemoglobin: 10 g/dL — ABNORMAL LOW (ref 12.0–15.0)
MCH: 32.7 pg (ref 26.0–34.0)
MCHC: 33.7 g/dL (ref 30.0–36.0)
MCV: 97.1 fL (ref 80.0–100.0)
Platelets: 175 10*3/uL (ref 150–400)
RBC: 3.06 MIL/uL — ABNORMAL LOW (ref 3.87–5.11)
RDW: 13.8 % (ref 11.5–15.5)
WBC: 9 10*3/uL (ref 4.0–10.5)
nRBC: 0 % (ref 0.0–0.2)

## 2020-01-18 LAB — BIRTH TISSUE RECOVERY COLLECTION (PLACENTA DONATION)

## 2020-01-18 NOTE — Lactation Note (Signed)
This note was copied from a baby's chart. Lactation Consultation Note  Patient Name: Beth Russell [id number] Date: 01/18/2020 Reason for consult: Follow-up assessment  Baby is 35 hours old with 2.84% weight loss. Mother is P4 and has experience BF other children. Mother reports breastfeeding is going well. Baby had four stools and one void.. Discussed engorgement signs and what to expect with milk coming in. Reviewed breastfeeding basics and encouraged to contact lactation services as needed for any questions or concerns.   Interventions Interventions: Breast feeding basics reviewed   Consult Status Consult Status: Complete Date: 01/18/20    Donnie Gedeon A Higuera Ancidey 01/18/2020, 6:30 PM

## 2020-01-18 NOTE — Progress Notes (Signed)
Post Partum Day 1 Subjective: Patient reports feeling well. She is tolerating PO. Ambulating and urinating without difficulty. Lochia minimal.  Objective: Blood pressure 125/75, pulse 90, temperature 99.1 F (37.3 C), temperature source Oral, resp. rate 18, height 5\' 1"  (1.549 m), weight 70.3 kg, SpO2 100 %, unknown if currently breastfeeding.  Physical Exam:  General: alert, cooperative and appears stated age Lochia: appropriate Uterine Fundus: firm Incision: NA DVT Evaluation: No evidence of DVT seen on physical exam.  Recent Labs    01/17/20 0630  HGB 13.9  HCT 40.8    Assessment/Plan: Plan for discharge tomorrow. Okay to discharge today if baby can; RN to page team for orders. Vitals stable Breastfeeding Desires BTL but does not have insurance; encouraged to f/u at [facility] for LARC placement    LOS: 1 day   03/18/20 01/18/2020, 6:10 AM

## 2020-01-18 NOTE — Discharge Instructions (Signed)
Parto vaginal, cuidados de puerperio Postpartum Care After Vaginal Delivery Lea esta informacin sobre cmo cuidarse desde el momento en que nazca su beb y hasta 6 a 12 semanas despus del parto (perodo del posparto). El mdico tambin podr darle instrucciones ms especficas. Comunquese con su mdico si tiene problemas o preguntas. Siga estas indicaciones en su casa: Hemorragia vaginal  Es normal tener un poco de hemorragia vaginal (loquios) despus del parto. Use un apsito sanitario para el sangrado vaginal y secrecin. ? Durante la primera semana despus del parto, la cantidad y el aspecto de los loquios a menudo es similar a las del perodo menstrual. ? Durante las siguientes semanas disminuir gradualmente hasta convertirse en una secrecin seca amarronada o amarillenta. ? En la mayora de las mujeres, los loquios se detienen completamente entre 4 a 6semanas despus del parto. Los sangrados vaginales pueden variar de mujer a mujer.  Cambie los apsitos sanitarios con frecuencia. Observe si hay cambios en el flujo, como: ? Un aumento repentino en el volumen. ? Cambio en el color. ? Cogulos sanguneos grandes.  Si expulsa un cogulo de sangre por la vagina, gurdelo y llame al mdico para informrselo. No deseche los cogulos de sangre por el inodoro antes de hablar con su mdico.  No use tampones ni se haga duchas vaginales hasta que el mdico la autorice.  Si no est amamantando, volver a tener su perodo entre 6 y 8 semanas despus del parto. Si solamente alimenta al beb con leche materna (lactancia materna exclusiva), podra no volver a tener su perodo hasta que deje de amamantar. Cuidados perineales  Mantenga la zona entre la vagina y el ano (perineo) limpia y seca, como se lo haya indicado el mdico. Utilice apsitos o aerosoles analgsicos y cremas, como se lo hayan indicado.  Si le hicieron un corte en el perineo (episiotoma) o tuvo un desgarro en la vagina, controle la  zona para detectar signos de infeccin hasta que sane. Est atenta a los siguientes signos: ? Aumento del enrojecimiento, la hinchazn o el dolor. ? Presenta lquido o sangre que supura del corte o desgarro. ? Calor. ? Pus o mal olor.  Es posible que le den una botella rociadora para que use en lugar de limpiarse el rea con papel higinico despus de usar el bao. Cuando comience a sanar, podr usar la botella rociadora antes de secarse. Asegrese de secarse suavemente.  Para aliviar el dolor causado por una episiotoma, un desgarro en la vagina o venas hinchadas en el ano (hemorroides), trate de tomar un bao de asiento tibio 2 o 3 veces por da. Un bao de asiento es un bao de agua tibia que se toma mientras se est sentado. El agua solo debe llegar hasta las caderas y cubrir las nalgas. Cuidado de las mamas  En los primeros das despus del parto, las mamas pueden sentirse pesadas, llenas e incmodas (congestin mamaria). Tambin puede escaparse leche de sus senos. El mdico puede sugerirle mtodos para aliviar este malestar. La congestin mamaria debera desaparecer al cabo de unos das.  Si est amamantando: ? Use un sostn que sujete y ajuste bien sus pechos. ? Mantenga los pezones secos y limpios. Aplquese cremas y ungentos, como se lo haya indicado el mdico. ? Es posible que deba usar discos de algodn en el sostn para absorber la leche que se filtre de sus senos. ? Puede tener contracciones uterinas cada vez que amamante durante varias semanas despus del parto. Las contracciones uterinas ayudan al tero a   regresar a su tamao habitual. ? Si tiene algn problema con la lactancia materna, colabore con el mdico o un asesor en lactancia.  Si no est amamantando: ? Evite tocarse mucho las mamas. Al hacerlo, podran producir ms leche. ? Use un sostn que le proporcione el ajuste correcto y compresas fras para reducir la hinchazn. ? No extraiga (saque) leche materna. Esto har que  produzca ms leche. Intimidad y sexualidad  Pregntele al mdico cundo puede retomar la actividad sexual. Esto puede depender de lo siguiente: ? Su riesgo de sufrir infecciones. ? La rapidez con la que est sanando. ? Su comodidad y deseo de retomar la actividad sexual.  Despus del parto, puede quedar embarazada incluso si no ha tenido todava su perodo. Si lo desea, hable con el mdico acerca de los mtodos de control de la natalidad (mtodos anticonceptivos). Medicamentos  Tome los medicamentos de venta libre y los recetados solamente como se lo haya indicado el mdico.  Si le recetaron un antibitico, tmelo como se lo haya indicado el mdico. No deje de tomar el antibitico aunque comience a sentirse mejor. Actividad  Retome sus actividades normales de a poco como se lo haya indicado el mdico. Pregntele al mdico qu actividades son seguras para usted.  Descanse todo lo que pueda. Trate de descansar o tomar una siesta mientras el beb duerme. Comida y bebida   Beba suficiente lquido como para mantener la orina de color amarillo plido.  Coma alimentos ricos en fibras todos los das. Estos pueden ayudarla a prevenir o aliviar el estreimiento. Los alimentos ricos en fibras incluyen, entre otros: ? Panes y cereales integrales. ? Arroz integral. ? Frijoles. ? Frutas y verduras frescas.  No intente perder de peso rpidamente reduciendo el consumo de caloras.  Tome sus vitaminas prenatales hasta la visita de seguimiento de posparto o hasta que su mdico le indique que puede dejar de tomarlas. Estilo de vida  No consuma ningn producto que contenga nicotina o tabaco, como cigarrillos y cigarrillos electrnicos. Si necesita ayuda para dejar de fumar, consulte al mdico.  No beba alcohol, especialmente si est amamantando. Instrucciones generales  Concurra a todas las visitas de seguimiento para usted y el beb, como se lo haya indicado el mdico. La mayora de las mujeres  visita al mdico para un seguimiento de posparto dentro de las primeras 3 a 6 semanas despus del parto. Comunquese con un mdico si:  Se siente incapaz de controlar los cambios que implica tener un hijo y esos sentimientos no desaparecen.  Siente tristeza o preocupacin de forma inusual.  Las mamas se ponen rojas, le duelen o se endurecen.  Tiene fiebre.  Tiene dificultad para retener la orina o para impedir que la orina se escape.  Tiene poco inters o falta de inters en actividades que solan gustarle.  No ha amamantado nada y no ha tenido un perodo menstrual durante 12 semanas despus del parto.  Dej de amamantar al beb y no ha tenido su perodo menstrual durante 12 semanas despus de dejar de amamantar.  Tiene preguntas sobre su cuidado y el del beb.  Elimina un cogulo de sangre grande por la vagina. Solicite ayuda de inmediato si:  Siente dolor en el pecho.  Tiene dificultad para respirar.  Tiene un dolor repentino e intenso en la pierna.  Tiene dolor intenso o clicos en el la parte inferior del abdomen.  Tiene una hemorragia tan intensa de la vagina que empapa ms de un apsito en una hora. El sangrado   no debe ser ms abundante que el perodo ms intenso que haya tenido.  Dolor de cabeza intenso.  Se desmaya.  Tiene visin borrosa o manchas en la vista.  Tiene secrecin vaginal con mal olor.  Tiene pensamientos acerca de lastimarse a usted misma o a su beb. Si alguna vez siente que puede lastimarse a usted misma o a otras personas, o tiene pensamientos de poner fin a su vida, busque ayuda de inmediato. Puede dirigirse al departamento de emergencias ms cercano o llamar a:  El servicio de emergencias de su localidad (911 en [place]  Una lnea de asistencia al suicida y atencin en crisis, como la Lnea Nacional de Prevencin del Suicidio (National Suicide Prevention Lifeline), al 1-800-273-8255. Est disponible las 24 horas del da. Resumen  El  perodo de tiempo justo despus el parto y hasta 6 a 12 semanas despus del parto se denomina perodo posparto.  Retome sus actividades normales de a poco como se lo haya indicado el mdico.  Concurra a todas las visitas de seguimiento para usted y el beb, como se lo haya indicado el mdico. Esta informacin no tiene como fin reemplazar el consejo del mdico. Asegrese de hacerle al mdico cualquier pregunta que tenga. Document Revised: 01/04/2018 Document Reviewed: 09/15/2017 Elsevier Patient Education  2020 Elsevier Inc.  

## 2020-04-14 ENCOUNTER — Encounter (HOSPITAL_COMMUNITY): Payer: Self-pay | Admitting: Emergency Medicine

## 2020-04-14 ENCOUNTER — Other Ambulatory Visit: Payer: Self-pay

## 2020-04-14 ENCOUNTER — Ambulatory Visit (HOSPITAL_COMMUNITY)
Admission: EM | Admit: 2020-04-14 | Discharge: 2020-04-14 | Disposition: A | Discharge status: Home / Self Care | Payer: Medicaid Other

## 2020-04-14 DIAGNOSIS — M79601 Pain in right arm: Secondary | ICD-10-CM

## 2020-04-14 DIAGNOSIS — T22231A Burn of second degree of right upper arm, initial encounter: Secondary | ICD-10-CM

## 2020-04-14 DIAGNOSIS — T2121XA Burn of second degree of chest wall, initial encounter: Secondary | ICD-10-CM

## 2020-04-14 DIAGNOSIS — R0789 Other chest pain: Secondary | ICD-10-CM

## 2020-04-14 MED ORDER — BACITRACIN ZINC 500 UNIT/GM EX OINT: 1.0000 "application " | TOPICAL_OINTMENT | Freq: Two times a day (BID) | CUTANEOUS | 0 refills | Status: DC

## 2020-04-14 MED ORDER — BACITRACIN ZINC 500 UNIT/GM EX OINT
TOPICAL_OINTMENT | CUTANEOUS | Status: AC
  Filled 2020-04-14: qty 28.35

## 2020-04-14 MED ORDER — SILVER SULFADIAZINE 1 % EX CREA
TOPICAL_CREAM | CUTANEOUS | Status: AC
  Filled 2020-04-14: qty 85
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
## 2020-05-09 ENCOUNTER — Ambulatory Visit: Payer: Self-pay | Attending: Family Medicine | Admitting: Family Medicine

## 2020-05-09 ENCOUNTER — Other Ambulatory Visit: Payer: Self-pay

## 2020-05-09 ENCOUNTER — Encounter: Payer: Self-pay | Admitting: Family Medicine

## 2020-05-09 VITALS — BP 100/62 | HR 78 | Ht 61.0 in | Wt 146.0 lb

## 2020-05-09 DIAGNOSIS — S5011XA Contusion of right forearm, initial encounter: Secondary | ICD-10-CM

## 2020-05-09 DIAGNOSIS — B3789 Other sites of candidiasis: Secondary | ICD-10-CM

## 2020-05-09 DIAGNOSIS — T148XXA Other injury of unspecified body region, initial encounter: Secondary | ICD-10-CM

## 2020-05-09 MED ORDER — CLOTRIMAZOLE 1 % EX CREA: 1.0000 "application " | TOPICAL_CREAM | Freq: Two times a day (BID) | CUTANEOUS | 1 refills | Status: DC

## 2020-05-09 NOTE — Progress Notes (Signed)
Having itching on right breast. Patient is currently breast feeding.  Has a bruise on her right forearm does not know how she got it.

## 2020-05-09 NOTE — Progress Notes (Signed)
Subjective:  Patient ID: Beth Russell, female    DOB: [birth year]  Age: 34 y.o. MRN: [id number]  CC: New Patient (Initial Visit)   HPI Beth Russell is a 34 year old female who presents today to establish care. Her breast has been itching but has improved lately. She denies her baby having oral thrush. She denies breast pain. Breast symptom have been on for 1 week Also has a R forearm bruise > 1 week. Has noticed bruising without trauma Sometimes notices R foot is weak on waking up but has no muscle cramps or fever.  Past Medical History:  Diagnosis Date  . Breast feeding status of mother   . History of sickle cell trait 08/25/2011    Past Surgical History:  Procedure Laterality Date  . DILATION AND CURETTAGE OF UTERUS N/A 10/06/2014   Procedure: SUCTION DILATATION AND CURETTAGE / BAKRI BALLOON PLACEMENT / REPAIR OF VAGINAL LACERATION;  Surgeon: Lesly Dukes, MD;  Location: [facility];  Service: Gynecology;  Laterality: N/A;    Family History  Problem Relation Age of Onset  . Cancer Sister        cervical  . Cancer Maternal Uncle        colon    No Known Allergies  Outpatient Medications Prior to Visit  Medication Sig Dispense Refill  . acetaminophen (TYLENOL) 325 MG tablet Take 650 mg by mouth every 6 (six) hours as needed. (Patient not taking: Reported on 05/09/2020)    . bacitracin ointment Apply 1 application topically 2 (two) times daily. (Patient not taking: Reported on 05/09/2020) 200 g 0  . Prenatal Vit-Fe Fumarate-FA (PRENATAL MULTIVITAMIN) TABS tablet Take 1 tablet by mouth daily at 12 noon. (Patient not taking: Reported on 05/09/2020)     No facility-administered medications prior to visit.     ROS Review of Systems  Constitutional: Negative for activity change, appetite change and fatigue.  HENT: Negative for congestion, sinus pressure and sore throat.   Eyes: Negative for visual disturbance.  Respiratory: Negative for cough, chest tightness,  shortness of breath and wheezing.   Cardiovascular: Negative for chest pain and palpitations.  Gastrointestinal: Negative for abdominal distention, abdominal pain and constipation.  Endocrine: Negative for polydipsia.  Genitourinary: Negative for dysuria and frequency.  Musculoskeletal: Negative for arthralgias and back pain.  Skin: Negative for color change and rash.  Neurological: Negative for tremors, light-headedness and numbness.  Hematological: Does not bruise/bleed easily.  Psychiatric/Behavioral: Negative for agitation and behavioral problems.    Objective:  BP (!) 100/62   Pulse 78   Ht 5\' 1"  (1.549 m)   Wt 146 lb (66.2 kg)   SpO2 99%   BMI 27.59 kg/m   BP/Weight 05/09/2020 04/14/2020 01/18/2020  Systolic BP 100 117 95  Diastolic BP 62 78 63  Wt. (Lbs) 146 - -  BMI 27.59 - -      Physical Exam Constitutional:      Appearance: She is well-developed.  Neck:     Vascular: No JVD.  Cardiovascular:     Rate and Rhythm: Normal rate.     Heart sounds: Normal heart sounds. No murmur heard.   Pulmonary:     Effort: Pulmonary effort is normal.     Breath sounds: Normal breath sounds. No wheezing or rales.  Chest:     Chest wall: No tenderness.     Breasts:        Right: Skin change (slightly erythematous nipple) present. No mass.  Left: Skin change (slightly erythematous nipple) present. No mass.  Abdominal:     General: Bowel sounds are normal. There is no distension.     Palpations: Abdomen is soft. There is no mass.     Tenderness: There is no abdominal tenderness.  Musculoskeletal:        General: Normal range of motion.     Right lower leg: No edema.     Left lower leg: No edema.  Skin:    Comments: Large bruise on ventral aspect of right forearm  Neurological:     Mental Status: She is alert and oriented to person, place, and time.  Psychiatric:        Mood and Affect: Mood normal.     CMP Latest Ref Rng & Units 04/01/2018 01/13/2018 07/12/2017    Glucose 65 - 99 mg/dL 89 132(G) 98  BUN 6 - 20 mg/dL 8 10 9   Creatinine 0.57 - 1.00 mg/dL 4.01 0.27  Sodium 134 - 144 mmol/L 142 138 137  Potassium 3.5 - 5.2 mmol/L 4.0 4.1 3.5  Chloride 96 - 106 mmol/L 105 105 104  CO2 20 - 29 mmol/L 24 22 24   Calcium 8.7 - 10.2 mg/dL 9.2 9.3 8.9  Total Protein 6.0 - 8.5 g/dL 7.3 - 6.8  Total Bilirubin 0.0 - 1.2 mg/dL 0.5 - 0.5  Alkaline Phos 39 - 117 IU/L 63 - 50  AST 0 - 40 IU/L 19 - 21  ALT 0 - 32 IU/L 20 - 15    Lipid Panel     Component Value Date/Time   CHOL 147 04/01/2018 0904   TRIG 162 (H) 04/01/2018 0904   HDL 44 04/01/2018 0904   LDLCALC 71 04/01/2018 0904    CBC    Component Value Date/Time   WBC 9.0 01/18/2020 0556   RBC 3.06 (L) 01/18/2020 0556   HGB 10.0 (L) 01/18/2020 0556   HGB 11.9 04/01/2018 0904   HCT 29.7 (L) 01/18/2020 0556   HCT 36.8 04/01/2018 0904   PLT 175 01/18/2020 0556   PLT 242 04/01/2018 0904   MCV 97.1 01/18/2020 0556   MCV 90 04/01/2018 0904   MCH 32.7 01/18/2020 0556   MCHC 33.7 01/18/2020 0556   RDW 13.8 01/18/2020 0556   RDW 14.5 04/01/2018 0904   LYMPHSABS 1.4 04/01/2018 0904   MONOABS 0.4 01/13/2018 1503   EOSABS 0.2 04/01/2018 0904   BASOSABS 0.0 04/01/2018 0904    No results found for: HGBA1C  Assessment & Plan:   1. Contusion of right forearm, initial encounter Unknown etiology Could be possibly trauma without her knowledge Advised to apply ice  2. Candidiasis of breast Patient to use clotrimazole after breast-feeding and clean with olive oil or coconut oil prior to nursing - clotrimazole (LOTRIMIN) 1 % cream; Apply 1 application topically 2 (two) times daily.  Dispense: 30 g; Refill: 1 - Basic Metabolic Panel  3. Bruising Unknown etiology We will send of labs to exclude autoimmune process - PT AND PTT - ANA,IFA RA Diag Pnl w/rflx Tit/Patn - Anti-DNA antibody, double-stranded - Sedimentation Rate - C-reactive protein  Meds ordered this encounter  Medications  .  clotrimazole (LOTRIMIN) 1 % cream    Sig: Apply 1 application topically 2 (two) times daily.    Dispense:  30 g    Refill:  1    Follow-up: Return if symptoms worsen or fail to improve.       04/03/2018, MD, FAAFP. [facility]  [place], [place] [phone]   05/09/2020, 9:26 AM

## 2020-05-11 LAB — PT AND PTT
INR: 1 (ref 0.9–1.2)
Prothrombin Time: 10 s (ref 9.1–12.0)
aPTT: 28 s (ref 24–33)

## 2020-05-11 LAB — ANA,IFA RA DIAG PNL W/RFLX TIT/PATN
ANA Titer 1: NEGATIVE
Cyclic Citrullin Peptide Ab: 6 units (ref 0–19)
Rheumatoid fact SerPl-aCnc: <10 IU/mL (ref 0.0–13.9)

## 2020-05-11 LAB — ANTI-DNA ANTIBODY, DOUBLE-STRANDED: dsDNA Ab: 1 IU/mL (ref 0–9)

## 2020-05-11 LAB — SEDIMENTATION RATE: Sed Rate: 4 mm/hr (ref 0–32)

## 2020-05-11 LAB — C-REACTIVE PROTEIN: CRP: 3 mg/L (ref 0–10)

## 2020-05-17 LAB — BASIC METABOLIC PANEL
BUN/Creatinine Ratio: 17 (ref 9–23)
BUN: 13 mg/dL (ref 6–20)
CO2: 19 mmol/L — ABNORMAL LOW (ref 20–29)
Calcium: 9.6 mg/dL (ref 8.7–10.2)
Chloride: 112 mmol/L — ABNORMAL HIGH (ref 96–106)
Creatinine, Ser: 0.75 mg/dL (ref 0.57–1.00)
GFR calc Af Amer: 121 mL/min/{1.73_m2} (ref >59)
GFR calc non Af Amer: 105 mL/min/{1.73_m2} (ref >59)
Glucose: 83 mg/dL (ref 65–99)
Potassium: 4.5 mmol/L (ref 3.5–5.2)
Sodium: 155 mmol/L — ABNORMAL HIGH (ref 134–144)

## 2020-05-17 LAB — SPECIMEN STATUS REPORT

## 2020-05-20 ENCOUNTER — Telehealth: Payer: Self-pay

## 2020-05-20 NOTE — Telephone Encounter (Signed)
Patient was called and a voicemail was left informing patient to return phone call for lab results. 

## 2020-05-20 NOTE — Telephone Encounter (Signed)
-----   Message from Enobong Newlin, MD sent at 05/16/2020  1:42 PM EDT ----- °Please inform the patient that labs are normal. Thank you. °

## 2020-05-30 ENCOUNTER — Other Ambulatory Visit: Payer: Self-pay

## 2020-05-30 ENCOUNTER — Ambulatory Visit: Payer: Self-pay | Attending: Family Medicine

## 2020-06-06 ENCOUNTER — Telehealth: Payer: Self-pay

## 2020-06-06 NOTE — Telephone Encounter (Signed)
Patient does not ave VM set up at this time to leave a message.

## 2020-06-06 NOTE — Telephone Encounter (Signed)
-----   Message from Hoy Register, MD sent at 05/16/2020  1:42 PM EDT ----- Please inform the patient that labs are normal. Thank you.

## 2020-06-09 ENCOUNTER — Telehealth (INDEPENDENT_AMBULATORY_CARE_PROVIDER_SITE_OTHER): Payer: Self-pay

## 2020-06-09 NOTE — Telephone Encounter (Signed)
Patient was given urgent tooth information to set up an appointment.

## 2020-06-09 NOTE — Telephone Encounter (Signed)
Patient called stating she would like a referral to be placed for a dental clinic patient states she is having dental pain and discomfort. Patient states she feels like she has cavities and would like to get checked.    Please advise patient [phone]

## 2020-10-18 ENCOUNTER — Other Ambulatory Visit: Payer: Self-pay | Admitting: Family Medicine

## 2020-10-18 ENCOUNTER — Ambulatory Visit: Payer: Self-pay | Attending: Family Medicine | Admitting: Family Medicine

## 2020-10-18 ENCOUNTER — Other Ambulatory Visit: Payer: Self-pay

## 2020-10-18 ENCOUNTER — Encounter: Payer: Self-pay | Admitting: Family Medicine

## 2020-10-18 DIAGNOSIS — K5909 Other constipation: Secondary | ICD-10-CM

## 2020-10-18 DIAGNOSIS — K648 Other hemorrhoids: Secondary | ICD-10-CM

## 2020-10-18 MED ORDER — POLYETHYLENE GLYCOL 3350 17 GM/SCOOP PO POWD: 17.0000 g | Freq: Every day | ORAL | 1 refills | Status: DC

## 2020-10-18 MED ORDER — HYDROCORT-PRAMOXINE (PERIANAL) 2.5-1 % EX CREA: 1.0000 "application " | TOPICAL_CREAM | Freq: Three times a day (TID) | CUTANEOUS | 1 refills | Status: DC

## 2020-10-18 NOTE — Progress Notes (Signed)
States that she is having pain near her ovaries.  Has had constipation and now she is having anal pain.

## 2020-10-18 NOTE — Progress Notes (Signed)
Virtual Visit via Telephone Note  I connected with Beth Russell, on 10/18/2020 at 1:38 PM by telephone due to the COVID-19 pandemic and verified that I am speaking with the correct person using two identifiers.   Consent: I discussed the limitations, risks, security and privacy concerns of performing an evaluation and management service by telephone and the availability of in person appointments. I also discussed with the patient that there may be a patient responsible charge related to this service. The patient expressed understanding and agreed to proceed.   Location of Patient: Home  Location of Provider: Home   Persons participating in Telemedicine visit: Keylin Garfield Cornea Farrington-CMA Dr. Alvis Lemmings Interpreter ID [phone] - Leretha Pol     History of Present Illness: 35 year old female seen for an acute visit.  She has been having constipation for the last 2 months and had a swelling in her rectal area but denies presence of bleeding but has a burning sensation in her rectum which feels like she has a sore. She also has abdominal pain and has tenismus. She has the urge to move her bowels but does not have a bowel movement Past Medical History:  Diagnosis Date  . Breast feeding status of mother   . History of sickle cell trait 08/25/2011   No Known Allergies  Current Outpatient Medications on File Prior to Visit  Medication Sig Dispense Refill  . acetaminophen (TYLENOL) 325 MG tablet Take 650 mg by mouth every 6 (six) hours as needed. (Patient not taking: No sig reported)    . bacitracin ointment Apply 1 application topically 2 (two) times daily. (Patient not taking: No sig reported) 200 g 0  . clotrimazole (LOTRIMIN) 1 % cream Apply 1 application topically 2 (two) times daily. (Patient not taking: Reported on 10/18/2020) 30 g 1  . Prenatal Vit-Fe Fumarate-FA (PRENATAL MULTIVITAMIN) TABS tablet Take 1 tablet by mouth daily at 12 noon. (Patient not taking: No sig  reported)     No current facility-administered medications on file prior to visit.    Observations/Objective: Awake, alert, oriented x3 Not in acute distress  Assessment and Plan: 1. Other constipation Advised to increase fiber intake - polyethylene glycol powder (GLYCOLAX/MIRALAX) 17 GM/SCOOP powder; Take 17 g by mouth daily.  Dispense: 3350 g; Refill: 1  2. Other hemorrhoids Discussed use of sitz baths Avoiding constipation is important - hydrocortisone-pramoxine (ANALPRAM HC) 2.5-1 % rectal cream; Place 1 application rectally 3 (three) times daily.  Dispense: 30 g; Refill: 1   Follow Up Instructions: Return if symptoms worsen or fail to improve.    I discussed the assessment and treatment plan with the patient. The patient was provided an opportunity to ask questions and all were answered. The patient agreed with the plan and demonstrated an understanding of the instructions.   The patient was advised to call back or seek an in-person evaluation if the symptoms worsen or if the condition fails to improve as anticipated.     I provided 13 minutes total of non-face-to-face time during this encounter including median intraservice time, reviewing previous notes, investigations, ordering medications, medical decision making, coordinating care and patient verbalized understanding at the end of the visit.     Hoy Register, MD, FAAFP. [facility] [place], [place] [phone]   10/18/2020, 1:38 PM

## 2021-02-11 ENCOUNTER — Encounter (HOSPITAL_COMMUNITY): Payer: Self-pay | Admitting: Emergency Medicine

## 2021-02-11 ENCOUNTER — Ambulatory Visit (HOSPITAL_COMMUNITY)
Admission: EM | Admit: 2021-02-11 | Discharge: 2021-02-11 | Disposition: A | Discharge status: Home / Self Care | Payer: Self-pay | Attending: Internal Medicine | Admitting: Internal Medicine

## 2021-02-11 ENCOUNTER — Other Ambulatory Visit: Payer: Self-pay

## 2021-02-11 DIAGNOSIS — R1084 Generalized abdominal pain: Secondary | ICD-10-CM | POA: Insufficient documentation

## 2021-02-11 DIAGNOSIS — N898 Other specified noninflammatory disorders of vagina: Secondary | ICD-10-CM | POA: Insufficient documentation

## 2021-02-11 LAB — POCT URINALYSIS DIPSTICK, ED / UC
Bilirubin Urine: NEGATIVE
Glucose, UA: NEGATIVE mg/dL
Hgb urine dipstick: NEGATIVE
Nitrite: NEGATIVE
Protein, ur: NEGATIVE mg/dL
Specific Gravity, Urine: 1.025 (ref 1.005–1.030)
Urobilinogen, UA: 0.2 mg/dL (ref 0.0–1.0)
pH: 5.5 (ref 5.0–8.0)

## 2021-02-11 LAB — POC URINE PREG, ED: Preg Test, Ur: NEGATIVE

## 2021-02-11 MED ORDER — FLUCONAZOLE 150 MG PO TABS
150.0000 mg | ORAL_TABLET | Freq: Every day | ORAL | 0 refills | Status: DC
  Filled 2021-02-14: qty 2, 4d supply, fill #0

## 2021-02-11 NOTE — ED Provider Notes (Signed)
[facility]    CSN: [id number] Arrival date & time: 02/11/21  1632      History   Chief Complaint Chief Complaint  Patient presents with  . Abdominal Pain  . Vaginal Itching    HPI Beth Russell is a 35 y.o. female.   Patient presents with vaginal discharge, vaginal itching, lower abdominal pain x4 days.  She denies fever, chills, vomiting, diarrhea, dysuria, hematuria, pelvic pain, rash, lesions, or other symptoms.  No treatments attempted at home.  She denies pertinent medical history.  The history is provided by the patient.    Past Medical History:  Diagnosis Date  . Breast feeding status of mother   . History of sickle cell trait 08/25/2011    Patient Active Problem List   Diagnosis Date Noted  . Labor and delivery, indication for care 01/17/2020  . [redacted] weeks gestation of pregnancy 01/17/2020  . UTI (urinary tract infection) in pregnancy in third trimester 07/31/2014  . Supervision of normal pregnancy 08/25/2011  . History of sickle cell trait 08/25/2011  . Language barrier-spanish speaking only  08/25/2011    Past Surgical History:  Procedure Laterality Date  . DILATION AND CURETTAGE OF UTERUS N/A 10/06/2014   Procedure: SUCTION DILATATION AND CURETTAGE / BAKRI BALLOON PLACEMENT / REPAIR OF VAGINAL LACERATION;  Surgeon: Lesly Dukes, MD;  Location: [facility];  Service: Gynecology;  Laterality: N/A;    OB History    Gravida  5   Para  4   Term  4   Preterm  0   AB  0   Living  4     SAB  0   IAB  0   Ectopic  0   Multiple  0   Live Births  4            Home Medications    Prior to Admission medications   Medication Sig Start Date End Date Taking? Authorizing Provider  fluconazole (DIFLUCAN) 150 MG tablet Take 1 tablet (150 mg total) by mouth daily. Take one tablet today.  May repeat in 3 days. 02/11/21  Yes Mickie Bail, NP  acetaminophen (TYLENOL) 325 MG tablet Take 650 mg by mouth every 6 (six) hours as  needed. Patient not taking: No sig reported    [provider]  bacitracin ointment Apply 1 application topically 2 (two) times daily. Patient not taking: No sig reported 04/14/20   Wallis Bamberg, PA-C  clotrimazole (LOTRIMIN) 1 % cream Apply 1 application topically 2 (two) times daily. Patient not taking: Reported on 10/18/2020 05/09/20   Hoy Register, MD  hydrocortisone-pramoxine (ANALPRAM HC) 2.5-1 % rectal cream Place 1 application rectally 3 (three) times daily. 10/18/20   Hoy Register, MD  hydrocortisone-pramoxine (ANALPRAM-HC) 2.5-1 % rectal cream PLACE 1 APPLICATION RECTALLY 3 (THREE) TIMES DAILY. 10/18/20 10/18/21  Hoy Register, MD  polyethylene glycol powder (GLYCOLAX/MIRALAX) 17 GM/SCOOP powder Take 17 g by mouth daily. 10/18/20   Hoy Register, MD  polyethylene glycol powder (GLYCOLAX/MIRALAX) 17 GM/SCOOP powder TAKE 17 GRAMS BY MOUTH DAILY. 10/18/20 10/18/21  Hoy Register, MD  Prenatal Vit-Fe Fumarate-FA (PRENATAL MULTIVITAMIN) TABS tablet Take 1 tablet by mouth daily at 12 noon. Patient not taking: No sig reported    [provider]    Family History Family History  Problem Relation Age of Onset  . Cancer Sister        cervical  . Cancer Maternal Uncle        colon  Social History Social History   Tobacco Use  . Smoking status: Never Smoker  . Smokeless tobacco: Never Used  Vaping Use  . Vaping Use: Never used  Substance Use Topics  . Alcohol use: No  . Drug use: No     Allergies   Patient has no known allergies.   Review of Systems Review of Systems  Constitutional: Negative for chills and fever.  Respiratory: Negative for cough and shortness of breath.   Cardiovascular: Negative for chest pain and palpitations.  Gastrointestinal: Positive for abdominal pain. Negative for diarrhea and vomiting.  Genitourinary: Positive for vaginal discharge. Negative for dysuria, flank pain, hematuria and pelvic pain.  Skin: Negative for color change  and rash.  All other systems reviewed and are negative.    Physical Exam Triage Vital Signs ED Triage Vitals  Enc Vitals Group     BP      Pulse      Resp      Temp      Temp src      SpO2      Weight      Height      Head Circumference      Peak Flow      Pain Score      Pain Loc      Pain Edu?      Excl. in GC?    No data found.  Updated Vital Signs BP 118/68 (BP Location: Right Arm)   Pulse 83   Temp 98.4 F (36.9 C) (Oral)   Resp 18   LMP 01/13/2021   SpO2 99%   Breastfeeding Yes   Visual Acuity Right Eye Distance:   Left Eye Distance:   Bilateral Distance:    Right Eye Near:   Left Eye Near:    Bilateral Near:     Physical Exam Vitals and nursing note reviewed.  Constitutional:      General: She is not in acute distress.    Appearance: She is well-developed. She is not ill-appearing.  HENT:     Head: Normocephalic and atraumatic.     Mouth/Throat:     Mouth: Mucous membranes are moist.  Eyes:     Conjunctiva/sclera: Conjunctivae normal.  Cardiovascular:     Rate and Rhythm: Normal rate and regular rhythm.     Heart sounds: Normal heart sounds.  Pulmonary:     Effort: Pulmonary effort is normal. No respiratory distress.     Breath sounds: Normal breath sounds.  Abdominal:     General: Bowel sounds are normal. There is no distension.     Palpations: Abdomen is soft.     Tenderness: There is no abdominal tenderness. There is no right CVA tenderness, left CVA tenderness, guarding or rebound.  Musculoskeletal:     Cervical back: Neck supple.  Skin:    General: Skin is warm and dry.  Neurological:     General: No focal deficit present.     Mental Status: She is alert and oriented to person, place, and time.     Gait: Gait normal.  Psychiatric:        Mood and Affect: Mood normal.        Behavior: Behavior normal.      UC Treatments / Results  Labs (all labs ordered are listed, but only abnormal results are displayed) Labs Reviewed   POCT URINALYSIS DIPSTICK, ED / UC - Abnormal; Notable for the following components:      Result Value  Ketones, ur TRACE (*)    Leukocytes,Ua TRACE (*)    All other components within normal limits  URINE CULTURE  POC URINE PREG, ED  CERVICOVAGINAL ANCILLARY ONLY    EKG   Radiology No results found.  Procedures Procedures (including critical care time)  Medications Ordered in UC Medications - No data to display  Initial Impression / Assessment and Plan / UC Course  I have reviewed the triage vital signs and the nursing notes.  Pertinent labs & imaging results that were available during my care of the patient were reviewed by me and considered in my medical decision making (see chart for details).   Vaginal discharge, vaginal itching, generalized abdominal pain.  Patient is well-appearing, afebrile, vital signs stable, exam reassuring, abdomen nontender.  Urine culture pending.  Patient obtained vaginal self swab for testing.  Treating with Diflucan.  Discussed with patient that she may require additional treatment if her test results are positive.  Instructed her to abstain from sexual activity for at least 7 days.  Instructed her to follow-up with her PCP or OB/GYN if her symptoms are not improving.  Patient agrees to plan of care.   Final Clinical Impressions(s) / UC Diagnoses   Final diagnoses:  Vaginal discharge  Vaginal itching  Generalized abdominal pain     Discharge Instructions     Take the Diflucan as directed.    Your vaginal tests are pending.  If your test results are positive, we will call you.  You and your sexual partner(s) may require treatment at that time.  Do not have sexual activity for at least 7 days.    Follow up with your primary care provider or gynecologist if your symptoms are not improving.          ED Prescriptions    Medication Sig Dispense Auth. Provider   fluconazole (DIFLUCAN) 150 MG tablet Take 1 tablet (150 mg total) by  mouth daily. Take one tablet today.  May repeat in 3 days. 2 tablet Mickie Bail, NP     PDMP not reviewed this encounter.   Mickie Bail, NP 02/11/21 1754

## 2021-02-11 NOTE — Discharge Instructions (Signed)
Take the Diflucan as directed.   Your vaginal tests are pending.  If your test results are positive, we will call you.  You and your sexual partner(s) may require treatment at that time.  Do not have sexual activity for at least 7 days.    Follow up with your primary care provider or gynecologist if your symptoms are not improving.      

## 2021-02-11 NOTE — ED Triage Notes (Signed)
Pt presents today with c/o of lower abdominal pain and vaginal itching x 4 days.

## 2021-02-12 LAB — URINE CULTURE

## 2021-02-13 LAB — CERVICOVAGINAL ANCILLARY ONLY
Bacterial Vaginitis (gardnerella): NEGATIVE
Candida Glabrata: NEGATIVE
Candida Vaginitis: POSITIVE — AB
Chlamydia: NEGATIVE
Comment: NEGATIVE
Comment: NEGATIVE
Comment: NEGATIVE
Comment: NEGATIVE
Comment: NEGATIVE
Comment: NORMAL
Neisseria Gonorrhea: NEGATIVE
Trichomonas: NEGATIVE

## 2021-02-14 ENCOUNTER — Other Ambulatory Visit: Payer: Self-pay

## 2021-02-15 ENCOUNTER — Telehealth: Payer: Self-pay | Admitting: Family Medicine

## 2021-02-15 NOTE — Telephone Encounter (Signed)
Yes she can take it.

## 2021-02-15 NOTE — Telephone Encounter (Signed)
Will route to PCP for review. 

## 2021-02-15 NOTE — Telephone Encounter (Signed)
Pt states she was prescribed the following medication from UC- and wants to know if she can take this while she's breastfeeding. Rx #: [id number]  fluconazole (DIFLUCAN) 150 MG tablet [[id number]]   Please advise and thank you

## 2021-02-16 NOTE — Telephone Encounter (Signed)
Pt was called and informed that she can take the medication.

## 2021-04-03 ENCOUNTER — Other Ambulatory Visit: Payer: Self-pay

## 2021-04-03 ENCOUNTER — Ambulatory Visit: Payer: Self-pay | Attending: Family Medicine | Admitting: Family Medicine

## 2021-04-03 ENCOUNTER — Encounter: Payer: Self-pay | Admitting: Family Medicine

## 2021-04-03 VITALS — BP 98/65 | HR 70 | Ht 61.0 in | Wt 126.8 lb

## 2021-04-03 DIAGNOSIS — Z01419 Encounter for gynecological examination (general) (routine) without abnormal findings: Secondary | ICD-10-CM | POA: Insufficient documentation

## 2021-04-03 DIAGNOSIS — Z13228 Encounter for screening for other metabolic disorders: Secondary | ICD-10-CM

## 2021-04-03 DIAGNOSIS — R1011 Right upper quadrant pain: Secondary | ICD-10-CM | POA: Insufficient documentation

## 2021-04-03 DIAGNOSIS — Z0000 Encounter for general adult medical examination without abnormal findings: Secondary | ICD-10-CM

## 2021-04-03 DIAGNOSIS — Z124 Encounter for screening for malignant neoplasm of cervix: Secondary | ICD-10-CM

## 2021-04-03 DIAGNOSIS — Z1159 Encounter for screening for other viral diseases: Secondary | ICD-10-CM

## 2021-04-03 DIAGNOSIS — Z1331 Encounter for screening for depression: Secondary | ICD-10-CM

## 2021-04-03 MED ORDER — OMEPRAZOLE 40 MG PO CPDR
40.0000 mg | DELAYED_RELEASE_CAPSULE | Freq: Every day | ORAL | 1 refills | Status: DC
  Filled 2021-04-03: qty 30, 30d supply, fill #0

## 2021-04-03 NOTE — Progress Notes (Signed)
Physical/Pap 

## 2021-04-03 NOTE — Patient Instructions (Signed)
Mantenimiento de la salud en las mujeres Health Maintenance, Female Adoptar un estilo de vida saludable y recibir atencin preventiva son importantes para promover la salud y el bienestar. Consulte al mdico sobre: El esquema adecuado para hacerse pruebas y exmenes peridicos. Cosas que puede hacer por su cuenta para prevenir enfermedades y mantenerse sana. Qu debo saber sobre la dieta, el peso y el ejercicio? Consuma una dieta saludable  Consuma una dieta que incluya muchas verduras, frutas, productos lcteos con bajo contenido de grasa y protenas magras. No consuma muchos alimentos ricos en grasas slidas, azcares agregados o sodio.  Mantenga un peso saludable El ndice de masa muscular (IMC) se utiliza para identificar problemas de peso. Proporciona una estimacin de la grasa corporal basndose en el peso y la altura. Su mdico puede ayudarle a determinar su IMC y a lograr o mantener unpeso saludable. Haga ejercicio con regularidad Haga ejercicio con regularidad. Esta es una de las prcticas ms importantes que puede hacer por su salud. La mayora de los adultos deben seguir estas pautas: Realizar, al menos, 150minutos de actividad fsica por semana. El ejercicio debe aumentar la frecuencia cardaca y hacerlo transpirar (ejercicio de intensidad moderada). Hacer ejercicios de fortalecimiento por lo menos dos veces por semana. Agregue esto a su plan de ejercicio de intensidad moderada. Pasar menos tiempo sentados. Incluso la actividad fsica ligera puede ser beneficiosa. Controle sus niveles de colesterol y lpidos en la sangre Comience a realizarse anlisis de lpidos y colesterol en la sangre a los20aos y luego reptalos cada 5aos. Hgase controlar los niveles de colesterol con mayor frecuencia si: Sus niveles de lpidos y colesterol son altos. Es mayor de 40aos. Presenta un alto riesgo de padecer enfermedades cardacas. Qu debo saber sobre las pruebas de deteccin del  cncer? Segn su historia clnica y sus antecedentes familiares, es posible que deba realizarse pruebas de deteccin del cncer en diferentes edades. Esto puede incluir pruebas de deteccin de lo siguiente: Cncer de mama. Cncer de cuello uterino. Cncer colorrectal. Cncer de piel. Cncer de pulmn. Qu debo saber sobre la enfermedad cardaca, la diabetes y la hipertensinarterial? Presin arterial y enfermedad cardaca La hipertensin arterial causa enfermedades cardacas y aumenta el riesgo de accidente cerebrovascular. Es ms probable que esto se manifieste en las personas que tienen lecturas de presin arterial alta, tienen ascendencia africana o tienen sobrepeso. Hgase controlar la presin arterial: Cada 3 a 5 aos si tiene entre 18 y 39 aos. Todos los aos si es mayor de 40aos. Diabetes Realcese exmenes de deteccin de la diabetes con regularidad. Este anlisis revisa el nivel de azcar en la sangre en ayunas. Hgase las pruebas de deteccin: Cada tresaos despus de los 40aos de edad si tiene un peso normal y un bajo riesgo de padecer diabetes. Con ms frecuencia y a partir de una edad inferior si tiene sobrepeso o un alto riesgo de padecer diabetes. Qu debo saber sobre la prevencin de infecciones? Hepatitis B Si tiene un riesgo ms alto de contraer hepatitis B, debe someterse a un examen de deteccin de este virus. Hable con el mdico para averiguar si tiene riesgode contraer la infeccin por hepatitis B. Hepatitis C Se recomienda el anlisis a: Todos los que nacieron entre 1945 y 1965. Todas las personas que tengan un riesgo de haber contrado hepatitis C. Enfermedades de transmisin sexual (ETS) Hgase las pruebas de deteccin de ITS, incluidas la gonorrea y la clamidia, si: Es sexualmente activa y es menor de 24aos. Es mayor de 24aos, y el mdico   le informa que corre riesgo de tener este tipo de infecciones. La actividad sexual ha cambiado desde que le  hicieron la ltima prueba de deteccin y tiene un riesgo mayor de tener clamidia o gonorrea. Pregntele al mdico si usted tiene riesgo. Pregntele al mdico si usted tiene un alto riesgo de contraer VIH. El mdico tambin puede recomendarle un medicamento recetado para ayudar a evitar la infeccin por el VIH. Si elige tomar medicamentos para prevenir el VIH, primero debe hacerse los anlisis de deteccin del VIH. Luego debe hacerse anlisis cada 3meses mientras est tomando los medicamentos. Embarazo Si est por dejar de menstruar (fase premenopusica) y usted puede quedar embarazada, busque asesoramiento antes de quedar embarazada. Tome de 400 a 800microgramos (mcg) de cido flico todos los das si queda embarazada. Pida mtodos de control de la natalidad (anticonceptivos) si desea evitar un embarazo no deseado. Osteoporosis y menopausia La osteoporosis es una enfermedad en la que los huesos pierden los minerales y la fuerza por el avance de la edad. El resultado pueden ser fracturas en los huesos. Si tiene 65aos o ms, o si est en riesgo de sufrir osteoporosis y fracturas, pregunte a su mdico si debe: Hacerse pruebas de deteccin de prdida sea. Tomar un suplemento de calcio o de vitamina D para reducir el riesgo de fracturas. Recibir terapia de reemplazo hormonal (TRH) para tratar los sntomas de la menopausia. Siga estas instrucciones en su casa: Estilo de vida No consuma ningn producto que contenga nicotina o tabaco, como cigarrillos, cigarrillos electrnicos y tabaco de mascar. Si necesita ayuda para dejar de fumar, consulte al mdico. No consuma drogas. No comparta agujas. Solicite ayuda a su mdico si necesita apoyo o informacin para abandonar las drogas. Consumo de alcohol No beba alcohol si: Su mdico le indica no hacerlo. Est embarazada, puede estar embarazada o est tratando de quedar embarazada. Si bebe alcohol: Limite la cantidad que consume de 0 a 1 medida por  da. Limite la ingesta si est amamantando. Est atento a la cantidad de alcohol que hay en las bebidas que toma. En los [place], una medida equivale a una botella de cerveza de 12oz (355ml), un vaso de vino de 5oz (148ml) o un vaso de una bebida alcohlica de alta graduacin de 1oz (44ml). Instrucciones generales Realcese los estudios de rutina de la salud, dentales y de la vista. Mantngase al da con las vacunas. Infrmele a su mdico si: Se siente deprimida con frecuencia. Alguna vez ha sido vctima de maltrato o no se siente segura en su casa. Resumen Adoptar un estilo de vida saludable y recibir atencin preventiva son importantes para promover la salud y el bienestar. Siga las instrucciones del mdico acerca de una dieta saludable, el ejercicio y la realizacin de pruebas o exmenes para detectar enfermedades. Siga las instrucciones del mdico con respecto al control del colesterol y la presin arterial. Esta informacin no tiene como fin reemplazar el consejo del mdico. Asegresede hacerle al mdico cualquier pregunta que tenga. Document Revised: 10/15/2018 Document Reviewed: 10/15/2018 Elsevier Patient Education  2022 Elsevier Inc.  

## 2021-04-03 NOTE — Progress Notes (Signed)
Subjective:  Patient ID: Krista Blue, female    DOB: [birth year]  Age: 35 y.o. MRN: [id number]  CC: Annual Exam and Gynecologic Exam   HPI Alvetta Rande Lawman is a 35 year old female patient who presents today for an annual physical exam.  Interval History: She is due for Pap smear but not due for mammogram and has no family history of breast cancer. She complains of right upper quadrant pain which is different from the pain she had complained of at her last office visit.  States pain has been present for 3 months and is sharp with no associated nausea, vomiting.  Pain is unrelated to ingestion of fatty foods or spicy foods. At her last office visit she had abdominal pain which was thought to be secondary to constipation and she was placed on MiraLAX. Past Medical History:  Diagnosis Date   Breast feeding status of mother    History of sickle cell trait 08/25/2011    Past Surgical History:  Procedure Laterality Date   DILATION AND CURETTAGE OF UTERUS N/A 10/06/2014   Procedure: SUCTION DILATATION AND CURETTAGE / BAKRI BALLOON PLACEMENT / REPAIR OF VAGINAL LACERATION;  Surgeon: Guss Bunde, MD;  Location: [facility];  Service: Gynecology;  Laterality: N/A;    Family History  Problem Relation Age of Onset   Cancer Sister        cervical   Cancer Maternal Uncle        colon    No Known Allergies  Outpatient Medications Prior to Visit  Medication Sig Dispense Refill   hydrocortisone-pramoxine (ANALPRAM-HC) 2.5-1 % rectal cream PLACE 1 APPLICATION RECTALLY 3 (THREE) TIMES DAILY. (Patient not taking: Reported on 04/03/2021) 30 g 1   Prenatal Vit-Fe Fumarate-FA (PRENATAL MULTIVITAMIN) TABS tablet Take 1 tablet by mouth daily at 12 noon. (Patient not taking: No sig reported)     acetaminophen (TYLENOL) 325 MG tablet Take 650 mg by mouth every 6 (six) hours as needed. (Patient not taking: No sig reported)     bacitracin ointment Apply 1 application topically 2 (two) times daily.  (Patient not taking: No sig reported) 200 g 0   clotrimazole (LOTRIMIN) 1 % cream Apply 1 application topically 2 (two) times daily. (Patient not taking: No sig reported) 30 g 1   fluconazole (DIFLUCAN) 150 MG tablet Take 1 tablet (150 mg total) by mouth daily. Take one tablet today.  May repeat in 3 days. (Patient not taking: Reported on 04/03/2021) 2 tablet 0   hydrocortisone-pramoxine (ANALPRAM HC) 2.5-1 % rectal cream Place 1 application rectally 3 (three) times daily. (Patient not taking: Reported on 04/03/2021) 30 g 1   polyethylene glycol powder (GLYCOLAX/MIRALAX) 17 GM/SCOOP powder Take 17 g by mouth daily. (Patient not taking: Reported on 04/03/2021) 3350 g 1   polyethylene glycol powder (GLYCOLAX/MIRALAX) 17 GM/SCOOP powder TAKE 17 GRAMS BY MOUTH DAILY. (Patient not taking: Reported on 04/03/2021) 3350 g 1   No facility-administered medications prior to visit.     ROS Review of Systems  Constitutional:  Negative for activity change, appetite change and fatigue.  HENT:  Negative for congestion, sinus pressure and sore throat.   Eyes:  Negative for visual disturbance.  Respiratory:  Negative for cough, chest tightness, shortness of breath and wheezing.   Cardiovascular:  Negative for chest pain and palpitations.  Gastrointestinal:  Positive for abdominal pain. Negative for abdominal distention and constipation.  Endocrine: Negative for polydipsia.  Genitourinary:  Negative for dysuria and frequency.  Musculoskeletal:  Negative for  arthralgias and back pain.  Skin:  Negative for rash.  Neurological:  Negative for tremors, light-headedness and numbness.  Hematological:  Does not bruise/bleed easily.  Psychiatric/Behavioral:  Negative for agitation and behavioral problems.    Objective:  BP 98/65   Pulse 70   Ht 5' 1"  (1.549 m)   Wt 126 lb 12.8 oz (57.5 kg)   SpO2 100%   BMI 23.96 kg/m   BP/Weight 04/03/2021 06/12/2840 12/08/4399  Systolic BP 98 027 253  Diastolic BP 65 68 62  Wt.  (Lbs) 126.8 - 146  BMI 23.96 - 27.59      Physical Exam Exam conducted with a chaperone present.  Constitutional:      General: She is not in acute distress.    Appearance: She is well-developed. She is not diaphoretic.  HENT:     Head: Normocephalic.     Right Ear: External ear normal.     Left Ear: External ear normal.     Nose: Nose normal.  Eyes:     Conjunctiva/sclera: Conjunctivae normal.     Pupils: Pupils are equal, round, and reactive to light.  Neck:     Vascular: No JVD.  Cardiovascular:     Rate and Rhythm: Normal rate and regular rhythm.     Heart sounds: Normal heart sounds. No murmur heard.   No gallop.  Pulmonary:     Effort: Pulmonary effort is normal. No respiratory distress.     Breath sounds: Normal breath sounds. No wheezing or rales.  Chest:     Chest wall: No tenderness.  Breasts:    Right: No mass or tenderness.     Left: No mass or tenderness.  Abdominal:     General: Bowel sounds are normal. There is no distension.     Palpations: Abdomen is soft. There is no mass.     Tenderness: There is no abdominal tenderness (negative Murphy's sign).  Musculoskeletal:        General: No tenderness. Normal range of motion.     Cervical back: Normal range of motion.     Right lower leg: No edema.     Left lower leg: No edema.  Skin:    General: Skin is warm and dry.  Neurological:     Mental Status: She is alert and oriented to person, place, and time.     Deep Tendon Reflexes: Reflexes are normal and symmetric.  Psychiatric:        Mood and Affect: Mood normal.    CMP Latest Ref Rng & Units 05/09/2020 04/01/2018 01/13/2018  Glucose 65 - 99 mg/dL 83 89 108(H)  BUN 6 - 20 mg/dL 13 8 10   Creatinine 0.57 - 1.00 mg/dL 0.75 0.64 0.61  Sodium 134 - 144 mmol/L 155(H) 142 138  Potassium 3.5 - 5.2 mmol/L 4.5 4.0 4.1  Chloride 96 - 106 mmol/L 112(H) 105 105  CO2 20 - 29 mmol/L 19(L) 24 22  Calcium 8.7 - 10.2 mg/dL 9.6 9.2 9.3  Total Protein 6.0 - 8.5 g/dL -  7.3 -  Total Bilirubin 0.0 - 1.2 mg/dL - 0.5 -  Alkaline Phos 39 - 117 IU/L - 63 -  AST 0 - 40 IU/L - 19 -  ALT 0 - 32 IU/L - 20 -    Lipid Panel     Component Value Date/Time   CHOL 147 04/01/2018 0904   TRIG 162 (H) 04/01/2018 0904   HDL 44 04/01/2018 0904   LDLCALC 71 04/01/2018 0904  CBC    Component Value Date/Time   WBC 9.0 01/18/2020 0556   RBC 3.06 (L) 01/18/2020 0556   HGB 10.0 (L) 01/18/2020 0556   HGB 11.9 04/01/2018 0904   HCT 29.7 (L) 01/18/2020 0556   HCT 36.8 04/01/2018 0904   PLT 175 01/18/2020 0556   PLT 242 04/01/2018 0904   MCV 97.1 01/18/2020 0556   MCV 90 04/01/2018 0904   MCH 32.7 01/18/2020 0556   MCHC 33.7 01/18/2020 0556   RDW 13.8 01/18/2020 0556   RDW 14.5 04/01/2018 0904   LYMPHSABS 1.4 04/01/2018 0904   MONOABS 0.4 01/13/2018 1503   EOSABS 0.2 04/01/2018 0904   BASOSABS 0.0 04/01/2018 0904    No results found for: HGBA1C  Assessment & Plan:  1. Annual physical exam Counseled on 150 minutes of exercise per week, healthy eating (including decreased daily intake of saturated fats, cholesterol, added sugars, sodium), STI prevention, routine healthcare maintenance. - CBC with Differential/Platelet  2. Screening for cervical cancer - Cytology - PAP([place])  3. Need for hepatitis C screening test - HCV RNA quant rflx ultra or genotyp([facility])  4. Screening for metabolic disorder - [id number] - Lipid panel  5. RUQ pain Negative Murphy sign We will treat presumptively for gastritis If symptoms persist consider right upper quadrant sonogram - omeprazole (PRILOSEC) 40 MG capsule; Take 1 capsule (40 mg total) by mouth daily.  Dispense: 30 capsule; Refill: 1   Meds ordered this encounter  Medications   omeprazole (PRILOSEC) 40 MG capsule    Sig: Take 1 capsule (40 mg total) by mouth daily.    Dispense:  30 capsule    Refill:  1    Follow-up: Return if symptoms worsen or fail to improve.       Charlott Rakes,  MD, FAAFP. [facility] and [place], [place]   04/03/2021, 12:45 PM

## 2021-04-05 LAB — CBC WITH DIFFERENTIAL/PLATELET
Basophils Absolute: 0 10*3/uL (ref 0.0–0.2)
Basos: 1 %
EOS (ABSOLUTE): 0.2 10*3/uL (ref 0.0–0.4)
Eos: 4 %
Hematocrit: 33.7 % — ABNORMAL LOW (ref 34.0–46.6)
Hemoglobin: 11.4 g/dL (ref 11.1–15.9)
Immature Grans (Abs): 0 10*3/uL (ref 0.0–0.1)
Immature Granulocytes: 0 %
Lymphocytes Absolute: 1.6 10*3/uL (ref 0.7–3.1)
Lymphs: 33 %
MCH: 30.2 pg (ref 26.6–33.0)
MCHC: 33.8 g/dL (ref 31.5–35.7)
MCV: 89 fL (ref 79–97)
Monocytes Absolute: 0.3 10*3/uL (ref 0.1–0.9)
Monocytes: 7 %
Neutrophils Absolute: 2.7 10*3/uL (ref 1.4–7.0)
Neutrophils: 55 %
Platelets: 205 10*3/uL (ref 150–450)
RBC: 3.77 x10E6/uL (ref 3.77–5.28)
RDW: 13 % (ref 11.7–15.4)
WBC: 4.8 10*3/uL (ref 3.4–10.8)

## 2021-04-05 LAB — HCV RNA QUANT RFLX ULTRA OR GENOTYP: HCV Quant Baseline: NOT DETECTED IU/mL

## 2021-04-05 LAB — LIPID PANEL
Chol/HDL Ratio: 2.4 ratio (ref 0.0–4.4)
Cholesterol, Total: 146 mg/dL (ref 100–199)
HDL: 60 mg/dL (ref >39)
LDL Chol Calc (NIH): 74 mg/dL (ref 0–99)
Triglycerides: 54 mg/dL (ref 0–149)
VLDL Cholesterol Cal: 12 mg/dL (ref 5–40)

## 2021-04-05 LAB — CMP14+EGFR
ALT: 13 IU/L (ref 0–32)
AST: 13 IU/L (ref 0–40)
Albumin/Globulin Ratio: 1.9 (ref 1.2–2.2)
Albumin: 4.5 g/dL (ref 3.8–4.8)
Alkaline Phosphatase: 79 IU/L (ref 44–121)
BUN/Creatinine Ratio: 24 — ABNORMAL HIGH (ref 9–23)
BUN: 13 mg/dL (ref 6–20)
Bilirubin Total: 0.4 mg/dL (ref 0.0–1.2)
CO2: 26 mmol/L (ref 20–29)
Calcium: 8.8 mg/dL (ref 8.7–10.2)
Chloride: 106 mmol/L (ref 96–106)
Creatinine, Ser: 0.54 mg/dL — ABNORMAL LOW (ref 0.57–1.00)
Globulin, Total: 2.4 g/dL (ref 1.5–4.5)
Glucose: 82 mg/dL (ref 65–99)
Potassium: 3.9 mmol/L (ref 3.5–5.2)
Sodium: 142 mmol/L (ref 134–144)
Total Protein: 6.9 g/dL (ref 6.0–8.5)
eGFR: 124 mL/min/{1.73_m2} (ref >59)

## 2021-04-05 LAB — CYTOLOGY - PAP
Comment: NEGATIVE
Diagnosis: NEGATIVE
High risk HPV: NEGATIVE

## 2021-04-07 ENCOUNTER — Telehealth: Payer: Self-pay

## 2021-04-07 NOTE — Telephone Encounter (Signed)
Patient name and DOB has been verified Patient was informed of lab results. Patient had no questions.  

## 2021-04-07 NOTE — Telephone Encounter (Signed)
-----   Message from Hoy Register, MD sent at 04/06/2021  3:26 PM EDT ----- PAP smear is negative for malignancy

## 2021-04-07 NOTE — Telephone Encounter (Signed)
-----   Message from Hoy Register, MD sent at 04/05/2021  1:08 PM EDT ----- Labs are stable.

## 2021-04-28 ENCOUNTER — Telehealth: Payer: Self-pay

## 2021-04-28 NOTE — Telephone Encounter (Signed)
Patient called in to inform Dr Alvis Lemmings that she have swelling of her gum and need an antibiotic does not want to take any OTC meds due to breastfeeding at this time. Please advise can  be reached at Ph# [phone]

## 2021-04-29 MED ORDER — AMOXICILLIN 500 MG PO CAPS
500.0000 mg | ORAL_CAPSULE | Freq: Three times a day (TID) | ORAL | 0 refills | Status: AC
  Filled 2021-04-29: qty 30, 10d supply, fill #0

## 2021-04-29 NOTE — Telephone Encounter (Signed)
Done

## 2021-05-01 ENCOUNTER — Other Ambulatory Visit: Payer: Self-pay

## 2021-09-07 ENCOUNTER — Ambulatory Visit: Payer: Self-pay | Admitting: *Deleted

## 2021-09-07 NOTE — Telephone Encounter (Signed)
Reason for Disposition  1 or 2 small sores  Answer Assessment - Initial Assessment Questions 1. OBJECT: "What do you think it is?"      Reports "lesion" in left side of nose  2. SIZE: "How large is it?"      Na  3. PAIN: "Are you having any pain?" If Yes, ask: "How bad is it?"  (Scale 1-10; or mild, moderate, severe)     Pain breathing in 4. SYMPTOMS: "Is it causing any other symptoms?" (e.g., nasal discharge)     Sneezing, recently had flu  5. ONSET: "How long do you think the foreign body has been in there?"     Sunday 09/03/21.  Answer Assessment - Initial Assessment Questions 1. APPEARANCE of SORES: "What do the sores look like?"     Patient reports "lesion" in left nose 2. NUMBER: "How many sores are there?"     Not sure  3. SIZE: "How big is the largest sore?"     Not sure  4. LOCATION: "Where are the sores located?"     Inside left side of nostril  5. ONSET: "When did the sores begin?"     Noted Sunday 09/03/21 6. CAUSE: "What do you think is causing the sores?"     Recent flu and nasal drainage  7. OTHER SYMPTOMS: "Do you have any other symptoms?" (e.g., fever, new weakness)     No . Had flu last week  Protocols used: Nose - Foreign Body-A-AH, Sores-A-AH

## 2021-09-07 NOTE — Telephone Encounter (Signed)
Patient called via interpreter [phone] wih c/o painful breathing in nose . C/o "lesion' or sore in nose left side since Sunday 09/03/21. Denies fever, shortness of breath, difficulty breathing. C/o pain breathing in nose. Eyes feel heavy no drainage. App scheduled for 09/19/21. Care advise given. Patient verbalized understanding of care advise and to call back or go to [facility] or ED if symptoms worsen.

## 2021-09-19 ENCOUNTER — Other Ambulatory Visit: Payer: Self-pay

## 2021-09-19 ENCOUNTER — Ambulatory Visit: Payer: Self-pay | Attending: Physician Assistant | Admitting: Family Medicine

## 2021-09-19 ENCOUNTER — Encounter: Payer: Self-pay | Admitting: Family Medicine

## 2021-09-19 VITALS — BP 96/63 | HR 93 | Ht 61.0 in | Wt 127.6 lb

## 2021-09-19 DIAGNOSIS — J329 Chronic sinusitis, unspecified: Secondary | ICD-10-CM

## 2021-09-19 MED ORDER — CETIRIZINE HCL 10 MG PO TABS
10.0000 mg | ORAL_TABLET | Freq: Every day | ORAL | 1 refills | Status: DC
  Filled 2021-09-19: qty 30, 30d supply, fill #0

## 2021-09-19 NOTE — Patient Instructions (Signed)

## 2021-09-19 NOTE — Progress Notes (Signed)
Subjective:  Patient ID: Beth Russell, female    DOB: [birth year]  Age: 35 y.o. MRN: [id number]  CC: NOSE PAIN   HPI Beth Russell is a 35 y.o. year old female here for an acute visit.  Interval History: She complains of pain  in both temples when she breathes, she has 'heaviness in her eyes', pain in her nostril, rhinorrhea. She has no pressure in her face, no post nasal drip, myalgia, fever. Symptoms have been present for 3 weeks. She also denies presence of dyspnea, chest pain. Past Medical History:  Diagnosis Date   Breast feeding status of mother    History of sickle cell trait 08/25/2011    Past Surgical History:  Procedure Laterality Date   DILATION AND CURETTAGE OF UTERUS N/A 10/06/2014   Procedure: SUCTION DILATATION AND CURETTAGE / BAKRI BALLOON PLACEMENT / REPAIR OF VAGINAL LACERATION;  Surgeon: Guss Bunde, MD;  Location: [facility];  Service: Gynecology;  Laterality: N/A;    Family History  Problem Relation Age of Onset   Cancer Sister        cervical   Cancer Maternal Uncle        colon    No Known Allergies  Outpatient Medications Prior to Visit  Medication Sig Dispense Refill   hydrocortisone-pramoxine (ANALPRAM-HC) 2.5-1 % rectal cream PLACE 1 APPLICATION RECTALLY 3 (THREE) TIMES DAILY. (Patient not taking: Reported on 04/03/2021) 30 g 1   omeprazole (PRILOSEC) 40 MG capsule Take 1 capsule (40 mg total) by mouth daily. (Patient not taking: Reported on 09/19/2021) 30 capsule 1   Prenatal Vit-Fe Fumarate-FA (PRENATAL MULTIVITAMIN) TABS tablet Take 1 tablet by mouth daily at 12 noon. (Patient not taking: Reported on 05/09/2020)     No facility-administered medications prior to visit.     ROS Review of Systems  Constitutional:  Negative for activity change, appetite change and fatigue.  HENT:  Positive for rhinorrhea and sinus pain. Negative for congestion, sinus pressure and sore throat.   Eyes:  Negative for visual disturbance.  Respiratory:   Negative for cough, chest tightness, shortness of breath and wheezing.   Cardiovascular:  Negative for chest pain and palpitations.  Gastrointestinal:  Negative for abdominal distention, abdominal pain and constipation.  Endocrine: Negative for polydipsia.  Genitourinary:  Negative for dysuria and frequency.  Musculoskeletal:  Negative for arthralgias and back pain.  Skin:  Negative for rash.  Neurological:  Negative for tremors, light-headedness and numbness.  Hematological:  Does not bruise/bleed easily.  Psychiatric/Behavioral:  Negative for agitation and behavioral problems.    Objective:  BP 96/63    Pulse 93    Ht 5' 1"  (1.549 m)    Wt 127 lb 9.6 oz (57.9 kg)    SpO2 99%    BMI 24.11 kg/m   BP/Weight 09/19/2021 05/02/3663 4/0/3474  Systolic BP 96 98 259  Diastolic BP 63 65 68  Wt. (Lbs) 127.6 126.8 -  BMI 24.11 23.96 -      Physical Exam Constitutional:      Appearance: She is well-developed.  HENT:     Right Ear: Tympanic membrane normal.     Left Ear: Tympanic membrane normal.     Nose: Nose normal.     Mouth/Throat:     Mouth: Mucous membranes are moist.  Cardiovascular:     Rate and Rhythm: Normal rate.     Heart sounds: Normal heart sounds. No murmur heard. Pulmonary:     Effort: Pulmonary effort is normal.  Breath sounds: Normal breath sounds. No wheezing or rales.  Chest:     Chest wall: No tenderness.  Abdominal:     General: Bowel sounds are normal. There is no distension.     Palpations: Abdomen is soft. There is no mass.     Tenderness: There is no abdominal tenderness.  Musculoskeletal:        General: Normal range of motion.     Right lower leg: No edema.     Left lower leg: No edema.  Neurological:     Mental Status: She is alert and oriented to person, place, and time.  Psychiatric:        Mood and Affect: Mood normal.    CMP Latest Ref Rng & Units 04/03/2021 05/09/2020 04/01/2018  Glucose 65 - 99 mg/dL 82 83 89  BUN 6 - 20 mg/dL 13 13 8    Creatinine 0.57 - 1.00 mg/dL 0.54(L) 0.75 0.64  Sodium 134 - 144 mmol/L 142 155(H) 142  Potassium 3.5 - 5.2 mmol/L 3.9 4.5 4.0  Chloride 96 - 106 mmol/L 106 112(H) 105  CO2 20 - 29 mmol/L 26 19(L) 24  Calcium 8.7 - 10.2 mg/dL 8.8 9.6 9.2  Total Protein 6.0 - 8.5 g/dL 6.9 - 7.3  Total Bilirubin 0.0 - 1.2 mg/dL 0.4 - 0.5  Alkaline Phos 44 - 121 IU/L 79 - 63  AST 0 - 40 IU/L 13 - 19  ALT 0 - 32 IU/L 13 - 20    Lipid Panel     Component Value Date/Time   CHOL 146 04/03/2021 1119   TRIG 54 04/03/2021 1119   HDL 60 04/03/2021 1119   CHOLHDL 2.4 04/03/2021 1119   LDLCALC 74 04/03/2021 1119    CBC    Component Value Date/Time   WBC 4.8 04/03/2021 1119   WBC 9.0 01/18/2020 0556   RBC 3.77 04/03/2021 1119   RBC 3.06 (L) 01/18/2020 0556   HGB 11.4 04/03/2021 1119   HCT 33.7 (L) 04/03/2021 1119   PLT 205 04/03/2021 1119   MCV 89 04/03/2021 1119   MCH 30.2 04/03/2021 1119   MCH 32.7 01/18/2020 0556   MCHC 33.8 04/03/2021 1119   MCHC 33.7 01/18/2020 0556   RDW 13.0 04/03/2021 1119   LYMPHSABS 1.6 04/03/2021 1119   MONOABS 0.4 01/13/2018 1503   EOSABS 0.2 04/03/2021 1119   BASOSABS 0.0 04/03/2021 1119    No results found for: HGBA1C  Assessment & Plan:  1. Other sinusitis, unspecified chronicity Nasal irrigation kit provided in the clinic No antibiotics indicated Will place on short course of Zyrtec given she is nursing.  Safety information has been reviewed with the patient and no known risk of infant harm based on limited human data hence short-term use is recommended weighing risk versus benefit. - cetirizine (ZYRTEC) 10 MG tablet; Take 1 tablet (10 mg total) by mouth daily.  Dispense: 30 tablet; Refill: 1    Meds ordered this encounter  Medications   cetirizine (ZYRTEC) 10 MG tablet    Sig: Take 1 tablet (10 mg total) by mouth daily.    Dispense:  30 tablet    Refill:  1    Follow-up: No follow-ups on file.       Charlott Rakes, MD, FAAFP. [facility] and [place], [employer]   09/19/2021, 10:29 AM

## 2021-09-19 NOTE — Progress Notes (Signed)
Has pain in nose.

## 2021-09-25 ENCOUNTER — Ambulatory Visit: Payer: Self-pay

## 2021-09-27 ENCOUNTER — Ambulatory Visit: Payer: Self-pay | Admitting: Physician Assistant

## 2022-02-05 ENCOUNTER — Other Ambulatory Visit: Payer: Self-pay

## 2022-02-05 ENCOUNTER — Ambulatory Visit (HOSPITAL_COMMUNITY)
Admission: EM | Admit: 2022-02-05 | Discharge: 2022-02-05 | Disposition: A | Discharge status: Home / Self Care | Payer: Self-pay | Attending: Family Medicine | Admitting: Family Medicine

## 2022-02-05 ENCOUNTER — Encounter (HOSPITAL_COMMUNITY): Payer: Self-pay

## 2022-02-05 ENCOUNTER — Ambulatory Visit: Payer: Self-pay | Admitting: *Deleted

## 2022-02-05 DIAGNOSIS — H1031 Unspecified acute conjunctivitis, right eye: Secondary | ICD-10-CM

## 2022-02-05 DIAGNOSIS — H6692 Otitis media, unspecified, left ear: Secondary | ICD-10-CM

## 2022-02-05 DIAGNOSIS — J069 Acute upper respiratory infection, unspecified: Secondary | ICD-10-CM

## 2022-02-05 MED ORDER — AMOXICILLIN 875 MG PO TABS
875.0000 mg | ORAL_TABLET | Freq: Two times a day (BID) | ORAL | 0 refills | Status: AC
  Filled 2022-02-05: qty 14, 7d supply, fill #0

## 2022-02-05 MED ORDER — GENTAMICIN SULFATE 0.3 % OP SOLN
2.0000 [drp] | Freq: Three times a day (TID) | OPHTHALMIC | 0 refills | Status: AC
  Filled 2022-02-05: qty 5, 5d supply, fill #0

## 2022-02-05 NOTE — Telephone Encounter (Signed)
Summary: pt eye swollen shut, itches, earache 5 day wants Nurse cb  ? Pt  FU [phone] Needs interpreter/ Spanish, states that her ears have been hurting for 5 days and now her eye is swollen almost shut. States it itchy and feels like something in it. No appt available/Cari not available. Pt states she was a nurse to call her back at [phone]   ?  ? ?Reason for Disposition ? SEVERE (e.g., excruciating) throat pain ? ?Answer Assessment - Initial Assessment Questions ?1. STREP EXPOSURE: "Was the exposure to someone who lives within your home?" If not, ask: "How much contact did you have with the sick individual?"  ?    Daughter- lives with patient ?2. ONSET: "How many days ago did the contact occur?"  ?    Daughter started her symptoms- Monday- 1 week ago ?3. PROVEN STREP: "Are you sure the person with strep had a positive throat culture or rapid strep test?"  ?    Yes -rapid test ?4. STREP SYMPTOMS: "Do YOU have a sore throat, fever, or other symptoms suggestive of strep?"  ?    Ear pain, eye swelling,sore throat ?5. VIRAL SYMPTOMS: "Are there any symptoms of a cold, such as a runny nose, cough, hoarse voice?" ?    congestion ?6. PREGNANCY: "Is there any chance you are pregnant?" "When was your last menstrual period?" ? ?Protocols used: Strep Throat Exposure-A-AH ? ?

## 2022-02-05 NOTE — Telephone Encounter (Signed)
Noted  

## 2022-02-05 NOTE — ED Triage Notes (Signed)
Pt presents today with sore throat, fatigue, sneezing, and bilateral ear pain ? ?Pt states her right eye is swollen. Pt states eye drainage. Pt states she is currently breastfeeding.  ?

## 2022-02-05 NOTE — ED Provider Notes (Addendum)
?[facility] ? ? ? ?CSN: [id number] ?Arrival date & time: 02/05/22  1144 ? ? ?  ? ?History   ?Chief Complaint ?Chief Complaint  ?Patient presents with  ? Sore Throat  ? Eye Drainage  ? ? ?HPI ?Beth Russell is a 36 y.o. female.  ? ? ?Sore Throat ? ?Here for a 1 week history of sore throat, rhinorrhea, bilateral ear pain, and malaise.  She has not measured a fever, but has felt warm and tired.  Some generalized aches.  No vomiting or diarrhea. ?She has also had some tearing and irritation in her right eye. ? ?She is still nursing her 35-year-old ? ?Not much cough but she has had some sneezing ? ?Past Medical History:  ?Diagnosis Date  ? Breast feeding status of mother   ? History of sickle cell trait 08/25/2011  ? ? ?Patient Active Problem List  ? Diagnosis Date Noted  ? Labor and delivery, indication for care 01/17/2020  ? [redacted] weeks gestation of pregnancy 01/17/2020  ? UTI (urinary tract infection) in pregnancy in third trimester 07/31/2014  ? Supervision of normal pregnancy 08/25/2011  ? History of sickle cell trait 08/25/2011  ? Language barrier-spanish speaking only  08/25/2011  ? ? ?Past Surgical History:  ?Procedure Laterality Date  ? DILATION AND CURETTAGE OF UTERUS N/A 10/06/2014  ? Procedure: SUCTION DILATATION AND CURETTAGE / BAKRI BALLOON PLACEMENT / REPAIR OF VAGINAL LACERATION;  Surgeon: Lesly Dukes, MD;  Location: [facility];  Service: Gynecology;  Laterality: N/A;  ? ? ?OB History   ? ? Gravida  ?5  ? Para  ?4  ? Term  ?4  ? Preterm  ?0  ? AB  ?0  ? Living  ?4  ?  ? ? SAB  ?0  ? IAB  ?0  ? Ectopic  ?0  ? Multiple  ?0  ? Live Births  ?4  ?   ?  ?  ? ? ? ?Home Medications   ? ?Prior to Admission medications   ?Medication Sig Start Date End Date Taking? Authorizing Provider  ?amoxicillin (AMOXIL) 875 MG tablet Take 1 tablet (875 mg total) by mouth 2 (two) times daily for 7 days. 02/05/22 02/12/22 Yes Zenia Resides, MD  ?gentamicin (GARAMYCIN) 0.3 % ophthalmic solution Place 2 drops into the  right eye 3 (three) times daily for 5 days. 02/05/22 02/10/22 Yes Zenia Resides, MD  ?cetirizine (ZYRTEC) 10 MG tablet Take 1 tablet (10 mg total) by mouth daily. 09/19/21   Hoy Register, MD  ? ? ?Family History ?Family History  ?Problem Relation Age of Onset  ? Cancer Sister   ?     cervical  ? Cancer Maternal Uncle   ?     colon  ? ? ?Social History ?Social History  ? ?Tobacco Use  ? Smoking status: Never  ? Smokeless tobacco: Never  ?Vaping Use  ? Vaping Use: Never used  ?Substance Use Topics  ? Alcohol use: No  ? Drug use: No  ? ? ? ?Allergies   ?Patient has no known allergies. ? ? ?Review of Systems ?Review of Systems ? ? ?Physical Exam ?Triage Vital Signs ?ED Triage Vitals  ?Enc Vitals Group  ?   BP 02/05/22 1343 108/71  ?   Pulse Rate 02/05/22 1343 80  ?   Resp 02/05/22 1343 18  ?   Temp 02/05/22 1343 98.3 ?F (36.8 ?C)  ?   Temp Source 02/05/22 1343 Oral  ?  SpO2 02/05/22 1343 96 %  ?   Weight --   ?   Height --   ?   Head Circumference --   ?   Peak Flow --   ?   Pain Score 02/05/22 1342 9  ?   Pain Loc --   ?   Pain Edu? --   ?   Excl. in GC? --   ? ?No data found. ? ?Updated Vital Signs ?BP 108/71 (BP Location: Left Arm)   Pulse 80   Temp 98.3 ?F (36.8 ?C) (Oral)   Resp 18   LMP 01/23/2022   SpO2 96%   Breastfeeding Yes  ? ?Visual Acuity ?Right Eye Distance:   ?Left Eye Distance:   ?Bilateral Distance:   ? ?Right Eye Near:   ?Left Eye Near:    ?Bilateral Near:    ? ?Physical Exam ?Vitals reviewed.  ?Constitutional:   ?   General: She is not in acute distress. ?   Appearance: She is not toxic-appearing.  ?HENT:  ?   Right Ear: Tympanic membrane and ear canal normal.  ?   Ears:  ?   Comments: Her left TM is mildly erythematous and dull. Right is gray and shiny ?   Nose: Nose normal.  ?   Mouth/Throat:  ?   Mouth: Mucous membranes are moist.  ?   Pharynx: No oropharyngeal exudate or posterior oropharyngeal erythema.  ?Eyes:  ?   Extraocular Movements: Extraocular movements intact.  ?   Pupils:  Pupils are equal, round, and reactive to light.  ?   Comments: Right eye is injected  ?Cardiovascular:  ?   Rate and Rhythm: Normal rate and regular rhythm.  ?   Heart sounds: No murmur heard. ?Pulmonary:  ?   Effort: Pulmonary effort is normal. No respiratory distress.  ?   Breath sounds: No wheezing, rhonchi or rales.  ?Musculoskeletal:  ?   Cervical back: Neck supple.  ?Lymphadenopathy:  ?   Cervical: No cervical adenopathy.  ?Skin: ?   Capillary Refill: Capillary refill takes less than 2 seconds.  ?   Coloration: Skin is not jaundiced or pale.  ?Neurological:  ?   General: No focal deficit present.  ?   Mental Status: She is alert and oriented to person, place, and time.  ?Psychiatric:     ?   Behavior: Behavior normal.  ? ? ? ?UC Treatments / Results  ?Labs ?(all labs ordered are listed, but only abnormal results are displayed) ?Labs Reviewed - No data to display ? ?EKG ? ? ?Radiology ?No results found. ? ?Procedures ?Procedures (including critical care time) ? ?Medications Ordered in UC ?Medications - No data to display ? ?Initial Impression / Assessment and Plan / UC Course  ?I have reviewed the triage vital signs and the nursing notes. ? ?Pertinent labs & imaging results that were available during my care of the patient were reviewed by me and considered in my medical decision making (see chart for details). ? ?  ? ?We will treat for otitis media on the left.  Discussed with her that this is most likely a viral infection in origin.  It is past 7 days, so I am not going to test for COVID or flu ?Final Clinical Impressions(s) / UC Diagnoses  ? ?Final diagnoses:  ?Viral upper respiratory tract infection  ?Acute conjunctivitis of right eye, unspecified acute conjunctivitis type  ?Left otitis media, unspecified otitis media type  ? ? ? ?Discharge Instructions   ? ?  ?  Take amoxicillin 875 mg--1 tab twice daily for 7 days (Tome Ud amoxicillin 875 mg--1 tableta 2 veces al dia por 10 dias)  ? ?You can try mucinex D  for the congestion (Ud puede probar Mucinex D para la flema/congestion) ? ?Gentamicin drops --1-2 drops in the right eye 3 times daily for conjunctivitis for 5 days.(Ponga las gotas en el ojo derecho 3 veces al dia por 5 dias) ? ? ? ? ?ED Prescriptions   ? ? Medication Sig Dispense Auth. Provider  ? amoxicillin (AMOXIL) 875 MG tablet Take 1 tablet (875 mg total) by mouth 2 (two) times daily for 7 days. 14 tablet Recardo Linn, Janace ArisPamela K, MD  ? gentamicin (GARAMYCIN) 0.3 % ophthalmic solution Place 2 drops into the right eye 3 (three) times daily for 5 days. 5 mL Zenia ResidesBanister, Angelina Venard K, MD  ? ?  ? ?PDMP not reviewed this encounter. ?  ?Zenia ResidesBanister, Josseline Reddin K, MD ?02/05/22 1405 ? ?  ?Zenia ResidesBanister, Kimo Bancroft K, MD ?02/05/22 1406 ? ?  ?Zenia ResidesBanister, Felma Pfefferle K, MD ?02/05/22 1410 ? ?

## 2022-02-05 NOTE — Telephone Encounter (Signed)
?  Chief Complaint: Interpreter: [id number] ?Symptoms: sore throat, eyes swollen, ear pain ?Frequency: 1 week ?Pertinent Negatives: Patient denies fever ?Disposition: [] ED /[x] Urgent Care (no appt availability in office) / [] Appointment(In office/virtual)/ []  [facility]/ [] Home Care/ [] Refused Recommended Disposition /[] [place] [facility]/ []  Follow-up with PCP ?Additional Notes: no open appointment- no access to [facility]- advised UC ?

## 2022-02-05 NOTE — Discharge Instructions (Addendum)
Take amoxicillin 875 mg--1 tab twice daily for 7 days (Tome Ud amoxicillin 875 mg--1 tableta 2 veces al dia por 10 dias)  ? ?You can try mucinex D for the congestion (Ud puede probar Mucinex D para la flema/congestion) ? ?Gentamicin drops --1-2 drops in the right eye 3 times daily for conjunctivitis for 5 days.(Ponga las gotas en el ojo derecho 3 veces al dia por 5 dias) ?

## 2022-02-06 ENCOUNTER — Other Ambulatory Visit: Payer: Self-pay

## 2022-05-09 ENCOUNTER — Encounter: Payer: Self-pay | Admitting: Family Medicine

## 2022-06-18 ENCOUNTER — Encounter: Payer: Self-pay | Admitting: Family Medicine

## 2022-07-17 ENCOUNTER — Encounter: Payer: Self-pay | Admitting: Family Medicine

## 2022-09-15 ENCOUNTER — Encounter (HOSPITAL_COMMUNITY): Payer: Self-pay | Admitting: *Deleted

## 2022-09-15 ENCOUNTER — Emergency Department (HOSPITAL_COMMUNITY)
Admission: EM | Admit: 2022-09-15 | Discharge: 2022-09-15 | Disposition: A | Discharge status: Home / Self Care | Payer: Self-pay | Attending: Emergency Medicine | Admitting: Emergency Medicine

## 2022-09-15 ENCOUNTER — Other Ambulatory Visit: Payer: Self-pay

## 2022-09-15 DIAGNOSIS — R6884 Jaw pain: Secondary | ICD-10-CM | POA: Insufficient documentation

## 2022-09-15 DIAGNOSIS — K0889 Other specified disorders of teeth and supporting structures: Secondary | ICD-10-CM | POA: Insufficient documentation

## 2022-09-15 MED ORDER — OXYCODONE HCL 5 MG PO TABS
5.0000 mg | ORAL_TABLET | Freq: Once | ORAL | Status: AC
  Administered 2022-09-15: 5 mg via ORAL
  Filled 2022-09-15: qty 1

## 2022-09-15 MED ORDER — OXYCODONE HCL 5 MG PO TABS: 5.0000 mg | ORAL_TABLET | Freq: Four times a day (QID) | ORAL | 0 refills | Status: DC | PRN

## 2022-09-15 MED ORDER — AMOXICILLIN-POT CLAVULANATE 875-125 MG PO TABS: 1.0000 | ORAL_TABLET | Freq: Two times a day (BID) | ORAL | 0 refills | Status: DC

## 2022-09-15 NOTE — ED Triage Notes (Signed)
The pt is c/o moouth and dental pain for over a week    lmp now

## 2022-09-15 NOTE — ED Provider Notes (Signed)
[facility] EMERGENCY DEPARTMENT Provider Note   CSN: [id number] Arrival date & time: 09/15/22  1954     History  Chief Complaint  Patient presents with   Dental Pain    Beth Russell is a 36 y.o. female.  Patient here with right jaw pain for the past week.  She reports that this is new pain for her.  She does not have a dentist. Has been taking tylenol but it has not been helpful.  She denies any fever or other systemic symptoms.  Her symptoms are localized to the lower jaw.  Per chart review, it looks like she had a similar complaint back in 2019.        Home Medications Prior to Admission medications   Medication Sig Start Date End Date Taking? Authorizing Provider  amoxicillin-clavulanate (AUGMENTIN) 875-125 MG tablet Take 1 tablet by mouth 2 (two) times daily. 09/15/22  Yes Alicia Amel, MD  cetirizine (ZYRTEC) 10 MG tablet Take 1 tablet (10 mg total) by mouth daily. 09/19/21   Hoy Register, MD      Allergies    Patient has no known allergies.    Review of Systems   Review of Systems  Constitutional:  Negative for activity change, chills and fever.  Neurological:  Negative for facial asymmetry.    Physical Exam Updated Vital Signs BP 105/66 (BP Location: Right Arm)   Pulse 72   Temp 98.5 F (36.9 C) (Oral)   Resp 16   Ht 5\' 1"  (1.549 m)   Wt 58 kg   LMP 09/15/2022   SpO2 100%   BMI 24.16 kg/m  Physical Exam Vitals reviewed.  Constitutional:      General: She is not in acute distress. HENT:     Mouth/Throat:     Mouth: Mucous membranes are moist.     Comments: Dental caries present, prior fillings present. No obviously broken teeth along lower right mandible. No identifiable abscess. No submental swelling or tenderness.  Eyes:     Extraocular Movements: Extraocular movements intact.  Cardiovascular:     Rate and Rhythm: Normal rate and regular rhythm.     Heart sounds: No murmur heard. Pulmonary:     Effort: Pulmonary  effort is normal.     Breath sounds: Normal breath sounds.  Abdominal:     General: Abdomen is flat.  Musculoskeletal:     Cervical back: No rigidity.  Lymphadenopathy:     Cervical: No cervical adenopathy.  Skin:    General: Skin is warm and dry.  Neurological:     General: No focal deficit present.  Psychiatric:        Mood and Affect: Mood normal.     ED Results / Procedures / Treatments   Labs (all labs ordered are listed, but only abnormal results are displayed) Labs Reviewed - No data to display  EKG None  Radiology No results found.  Procedures Procedures    Medications Ordered in ED Medications  oxyCODONE (Oxy IR/ROXICODONE) immediate release tablet 5 mg (5 mg Oral Given 09/15/22 2200)    ED Course/ Medical Decision Making/ A&P                           Medical Decision Making Patient here with dental pain for the past week.  No fevers or systemic symptoms.  No neck asymmetry to suggest a invasive local infection. Single dose of oxycodone given here in the ED.  Suspect this is dental in etiology though no evident broken teeth or abscess on exam.  Will treat with antibiotics and Tylenol/Ibuprofen PRN and have her follow-up with a dentist ASAP.  Risk Prescription drug management.   Final Clinical Impression(s) / ED Diagnoses Final diagnoses:  Pain, dental    Rx / DC Orders ED Discharge Orders          Ordered    amoxicillin-clavulanate (AUGMENTIN) 875-125 MG tablet  2 times daily        09/15/22 2137    oxyCODONE (ROXICODONE) 5 MG immediate release tablet  Every 6 hours PRN,   Status:  Discontinued        09/15/22 2137           Dorothyann Gibbs, MD    Alicia Amel, MD 09/15/22 2238    Benjiman Core, MD 09/15/22 2313

## 2022-09-15 NOTE — Discharge Instructions (Addendum)
Ms. Beth Russell,  I'm so sorry you're having this pain. I do not see any evidence of a broken tooth or anything that we can intervene on here in the Emergency Department. Ultimately, you will need to see a dentist soon. I am sending some antibiotics to a 24 hour pharmacy to treat you until you can get in with a dentist. Please call a dentist ASAP on Monday morning to make an appointment.   Dorothyann Gibbs, MD

## 2023-05-03 ENCOUNTER — Other Ambulatory Visit: Payer: Self-pay

## 2023-05-03 MED ORDER — ACETAMINOPHEN-CODEINE 300-30 MG PO TABS
1.0000 | ORAL_TABLET | ORAL | 0 refills | Status: DC | PRN
  Filled 2023-05-03: qty 8, 2d supply, fill #0

## 2023-05-03 MED ORDER — CLINDAMYCIN HCL 300 MG PO CAPS
300.0000 mg | ORAL_CAPSULE | Freq: Three times a day (TID) | ORAL | 0 refills | Status: DC
  Filled 2023-05-03: qty 9, 3d supply, fill #0

## 2023-05-03 MED ORDER — CHLORHEXIDINE GLUCONATE 0.12 % MT SOLN
OROMUCOSAL | 0 refills | Status: DC
  Filled 2023-05-03: qty 473, 10d supply, fill #0

## 2023-05-08 ENCOUNTER — Encounter (HOSPITAL_COMMUNITY): Payer: Self-pay

## 2023-05-08 ENCOUNTER — Ambulatory Visit (HOSPITAL_COMMUNITY)
Admission: EM | Admit: 2023-05-08 | Discharge: 2023-05-08 | Disposition: A | Discharge status: Home / Self Care | Payer: Self-pay | Attending: Emergency Medicine | Admitting: Emergency Medicine

## 2023-05-08 DIAGNOSIS — N760 Acute vaginitis: Secondary | ICD-10-CM | POA: Insufficient documentation

## 2023-05-08 LAB — POCT URINALYSIS DIP (MANUAL ENTRY)
Bilirubin, UA: NEGATIVE
Glucose, UA: NEGATIVE mg/dL
Ketones, POC UA: NEGATIVE mg/dL
Leukocytes, UA: NEGATIVE
Nitrite, UA: NEGATIVE
Protein Ur, POC: NEGATIVE mg/dL
Spec Grav, UA: 1.025 (ref 1.010–1.025)
Urobilinogen, UA: 0.2 E.U./dL
pH, UA: 5 (ref 5.0–8.0)

## 2023-05-08 LAB — POCT URINE PREGNANCY: Preg Test, Ur: NEGATIVE

## 2023-05-08 MED ORDER — FLUCONAZOLE 150 MG PO TABS
ORAL_TABLET | ORAL | 0 refills | Status: DC
  Filled 2023-05-09: qty 2, 3d supply, fill #0

## 2023-05-08 NOTE — ED Provider Notes (Signed)
[facility]    CSN: [id number] Arrival date & time: 05/08/23  1920      History   Chief Complaint Chief Complaint  Patient presents with   Abdominal Pain    HPI Janicia Izora Gala is a 37 y.o. female.   Patient presents to clinic for lower abdominal pain and 'pain in her uterus.'  She had her menstrual cycle last week.  Reports some nausea without emesis.  Endorses vaginal itching and some intermittent vaginal odor.  Reports recurrent vaginal yeast infections.   Denies any fevers, dysuria, appetite changes, emesis or diarrhea.  No recent illness.  Patient's daughter to translate for her, declined medical interpreter.  The history is provided by the patient and medical records. The history is limited by a language barrier.  Abdominal Pain Associated symptoms: nausea and vaginal discharge   Associated symptoms: no diarrhea, no dysuria, no fever and no vomiting     Past Medical History:  Diagnosis Date   Breast feeding status of mother    History of sickle cell trait 08/25/2011    Patient Active Problem List   Diagnosis Date Noted   Labor and delivery, indication for care 01/17/2020   [redacted] weeks gestation of pregnancy 01/17/2020   UTI (urinary tract infection) in pregnancy in third trimester 07/31/2014   Supervision of normal pregnancy 08/25/2011   History of sickle cell trait 08/25/2011   Language barrier-spanish speaking only  08/25/2011    Past Surgical History:  Procedure Laterality Date   DILATION AND CURETTAGE OF UTERUS N/A 10/06/2014   Procedure: SUCTION DILATATION AND CURETTAGE / BAKRI BALLOON PLACEMENT / REPAIR OF VAGINAL LACERATION;  Surgeon: Lesly Dukes, MD;  Location: [facility];  Service: Gynecology;  Laterality: N/A;    OB History     Gravida  5   Para  4   Term  4   Preterm  0   AB  0   Living  4      SAB  0   IAB  0   Ectopic  0   Multiple  0   Live Births  4            Home Medications    Prior to Admission  medications   Medication Sig Start Date End Date Taking? Authorizing Provider  fluconazole (DIFLUCAN) 150 MG tablet Take one tablet today and another in 72 hours if vaginal itching persists. 05/08/23  Yes Evan Mackie, [place] N, FNP    Family History Family History  Problem Relation Age of Onset   Cancer Sister        cervical   Cancer Maternal Uncle        colon    Social History Social History   Tobacco Use   Smoking status: Never   Smokeless tobacco: Never  Vaping Use   Vaping status: Never Used  Substance Use Topics   Alcohol use: No   Drug use: No     Allergies   Patient has no known allergies.   Review of Systems Review of Systems  Constitutional:  Negative for fever.  Gastrointestinal:  Positive for abdominal pain and nausea. Negative for diarrhea and vomiting.  Genitourinary:  Positive for vaginal discharge. Negative for dysuria, genital sores and menstrual problem.     Physical Exam Triage Vital Signs ED Triage Vitals  Encounter Vitals Group     BP 05/08/23 2009 104/68     Systolic BP Percentile --      Diastolic BP Percentile --  Pulse Rate 05/08/23 2009 81     Resp 05/08/23 2009 16     Temp 05/08/23 2009 98 F (36.7 C)     Temp Source 05/08/23 2009 Oral     SpO2 05/08/23 2009 94 %     Weight --      Height 05/08/23 2008 5\' 2"  (1.575 m)     Head Circumference --      Peak Flow --      Pain Score 05/08/23 2008 6     Pain Loc --      Pain Education --      Exclude from Growth Chart --    No data found.  Updated Vital Signs BP 104/68 (BP Location: Right Arm)   Pulse 81   Temp 98 F (36.7 C) (Oral)   Resp 16   Ht 5\' 2"  (1.575 m)   LMP 05/03/2023 (Exact Date)   SpO2 94%   Breastfeeding No   BMI 23.38 kg/m   Visual Acuity Right Eye Distance:   Left Eye Distance:   Bilateral Distance:    Right Eye Near:   Left Eye Near:    Bilateral Near:     Physical Exam Vitals and nursing note reviewed.  Constitutional:      Appearance:  She is well-developed.  HENT:     Head: Normocephalic and atraumatic.  Cardiovascular:     Rate and Rhythm: Normal rate.  Pulmonary:     Effort: Pulmonary effort is normal. No respiratory distress.  Abdominal:     General: Abdomen is flat. Bowel sounds are normal. There is no distension or abdominal bruit.     Palpations: Abdomen is soft.     Tenderness: There is no abdominal tenderness.  Skin:    General: Skin is warm and dry.  Neurological:     General: No focal deficit present.     Mental Status: She is alert and oriented to person, place, and time.  Psychiatric:        Mood and Affect: Mood normal.        Behavior: Behavior normal.      UC Treatments / Results  Labs (all labs ordered are listed, but only abnormal results are displayed) Labs Reviewed  POCT URINALYSIS DIP (MANUAL ENTRY) - Abnormal; Notable for the following components:      Result Value   Clarity, UA cloudy (*)    Blood, UA small (*)    All other components within normal limits  POCT URINE PREGNANCY  CERVICOVAGINAL ANCILLARY ONLY    EKG   Radiology No results found.  Procedures Procedures (including critical care time)  Medications Ordered in UC Medications - No data to display  Initial Impression / Assessment and Plan / UC Course  I have reviewed the triage vital signs and the nursing notes.  Pertinent labs & imaging results that were available during my care of the patient were reviewed by me and considered in my medical decision making (see chart for details).  Vitals in triage reviewed, patient is hemodynamically stable.  Abdomen is soft and nontender to palpation with active bowel sounds.  Urine pregnancy is negative, urinalysis shows small red blood cells, negative for nitrates or leukocytes.  Without dysuria.  Does have vaginal discharge and itching, history of recurrent yeast infections, will treat with fluconazole.  Cytology swab obtained.  Staff to contact patient if treatment  modification is indicated.  Plan of care, follow-up care and return precautions given, no questions at this time.  Final Clinical Impressions(s) / UC Diagnoses   Final diagnoses:  Acute vaginitis     Discharge Instructions      We have obtained a cytology swab and will call if we need to modify your treatment plan.  You may have a yeast infection, please take the fluconazole today and again in 72 hours if vaginal itching persists.   Return to clinic or follow-up with your primary care for any new or concerning symptoms.     ED Prescriptions     Medication Sig Dispense Auth. Provider   fluconazole (DIFLUCAN) 150 MG tablet Take one tablet today and another in 72 hours if vaginal itching persists. 2 tablet Cyndi Montejano, [place] N, FNP      PDMP not reviewed this encounter.   Kalena Mander, [place] N, [place] 05/08/23 2039

## 2023-05-08 NOTE — Discharge Instructions (Addendum)
We have obtained a cytology swab and will call if we need to modify your treatment plan.  You may have a yeast infection, please take the fluconazole today and again in 72 hours if vaginal itching persists.   Return to clinic or follow-up with your primary care for any new or concerning symptoms.

## 2023-05-08 NOTE — ED Triage Notes (Signed)
Patient here today with c/o lower abd pain X 3 weeks. She took Tylenol but did not help.

## 2023-05-09 ENCOUNTER — Other Ambulatory Visit: Payer: Self-pay

## 2023-05-23 ENCOUNTER — Encounter: Payer: Self-pay | Admitting: Physician Assistant

## 2023-05-23 ENCOUNTER — Ambulatory Visit: Payer: Self-pay | Attending: Physician Assistant | Admitting: Physician Assistant

## 2023-05-23 ENCOUNTER — Other Ambulatory Visit (HOSPITAL_COMMUNITY)
Admission: RE | Admit: 2023-05-23 | Discharge: 2023-05-23 | Disposition: A | Discharge status: Home / Self Care | Payer: Self-pay | Source: Ambulatory Visit | Attending: Physician Assistant | Admitting: Physician Assistant

## 2023-05-23 ENCOUNTER — Other Ambulatory Visit: Payer: Self-pay

## 2023-05-23 VITALS — BP 109/71 | HR 94 | Wt 144.6 lb

## 2023-05-23 DIAGNOSIS — Z09 Encounter for follow-up examination after completed treatment for conditions other than malignant neoplasm: Secondary | ICD-10-CM

## 2023-05-23 DIAGNOSIS — Z603 Acculturation difficulty: Secondary | ICD-10-CM

## 2023-05-23 DIAGNOSIS — B379 Candidiasis, unspecified: Secondary | ICD-10-CM

## 2023-05-23 DIAGNOSIS — R102 Pelvic and perineal pain: Secondary | ICD-10-CM

## 2023-05-23 LAB — GLUCOSE, POCT (MANUAL RESULT ENTRY): POC Glucose: 100 mg/dl — AB (ref 70–99)

## 2023-05-23 MED ORDER — NAPROXEN 500 MG PO TABS
500.0000 mg | ORAL_TABLET | Freq: Two times a day (BID) | ORAL | 1 refills | Status: DC
  Filled 2023-05-23: qty 60, 30d supply, fill #0

## 2023-05-23 NOTE — Patient Instructions (Signed)
Increase water intake-drink 64 to 80 ounces water daily.     Dolor plvico en la mujer Pelvic Pain, Female El dolor plvico se siente en la parte inferior del vientre (abdomen), debajo del ombligo y a nivel de las caderas. El dolor puede tener las siguientes caractersticas: Games developer de repente (ser agudo). Volver a presentarse (ser recurrente). Durar mucho tiempo (volverse crnico). El dolor plvico que dura ms de 6 meses se denomina dolor plvico crnico. El dolor plvico puede tener muchas causas. A veces, la causa del dolor plvico no se conoce. Siga estas instrucciones en su casa:  Use los medicamentos de venta libre y los recetados solamente como se lo haya indicado el mdico. Haga reposo como se lo haya indicado el mdico. No tenga sexo si siente dolor. Lleve un registro del dolor plvico. Escriba los siguientes datos: Cundo comenz Chief Technology Officer. La ubicacin del dolor. Qu parece mejorar o Administrator, arts, como alimentos o su perodo mensual (ciclo menstrual). Cualquier sntoma que se presente junto con Chief Technology Officer. Concurra a todas las visitas de seguimiento. Comunquese con un mdico si: El medicamento no [employer], o el dolor regresa. Aparecen nuevos sntomas. Tiene sangrado o secrecin inusual de la vagina. Tiene fiebre o escalofros. Tiene dificultad para defecar (estreimiento). Observa sangre en el pis (orina) o en la materia fecal (heces). El pis tiene mal olor. Se siente dbil o siente que va a desvanecerse. Solicite ayuda de inmediato si: Freight forwarder repentino y Optometrist. Siente un dolor muy intenso y tambin tiene alguno de estos sntomas: Grant Ruts. Sensacin de que va a vomitar (nuseas). Vmitos. Tiene mucho sudor. Se desmaya. Estos sntomas pueden Customer service manager. Solicite ayuda de inmediato. Comunquese con el servicio de emergencias de su localidad (911 en los [place]). No espere a ver si los sntomas desaparecen. No conduzca por  sus propios medios [employer]. Resumen El dolor plvico se siente en la parte inferior del vientre (abdomen), debajo del ombligo y a nivel de las caderas. El dolor plvico puede tener muchas causas. Lleve un registro del dolor plvico. Esta informacin no tiene Theme park manager el consejo del mdico. Asegrese de hacerle al mdico cualquier pregunta que tenga. Document Revised: 02/23/2021 Document Reviewed: 02/23/2021 Elsevier Patient Education  2024 [employer].

## 2023-05-23 NOTE — Progress Notes (Signed)
Patient ID: Beth Russell, female   DOB: 1986/04/24, 37 y.o.   MRN: [id number]      Beth Russell, is a 37 y.o. female  [id number]:[id number]  [id number]:[id number]  DOB - Mar 28, 1986  Chief Complaint  Patient presents with   Vaginitis       Subjective:   Beth Russell is a 37 y.o. female here today for a follow up visit LMP 04/27/2023.  She is still having mild to moderate pelvic cramping that waxes and wanes in intensity.  She has not taken anything for it.  She was treated with diflucan for yeast infection.  Pain on both sides.  No vomiting.  Last period was normal.  Uses condoms for [facility].  Refuses interpreter.  Her daughter is translating.  UHCG was neg.  No fever or chills.  Occasional nausea.  No abnormal vaginal discharge.    ED 05/08/2023 for vaginal discharge and was +yeast infection and treated with diflucan.  Ed note:  HPI Beth Russell is a 37 y.o. female.    Patient presents to clinic for lower abdominal pain and 'pain in her uterus.'  She had her menstrual cycle last week.  Reports some nausea without emesis.  Endorses vaginal itching and some intermittent vaginal odor.  Reports recurrent vaginal yeast infections.    Denies any fevers, dysuria, appetite changes, emesis or diarrhea.  No recent illness.   Patient's daughter to translate for her, declined medical interpreter.  Associated symptoms: nausea and vaginal discharge   Associated symptoms: no diarrhea, no dysuria, no fever and no vomiting     A/P: Vitals in triage reviewed, patient is hemodynamically stable. Abdomen is soft and nontender to palpation with active bowel sounds. Urine pregnancy is negative, urinalysis shows small red blood cells, negative for nitrates or leukocytes. Without dysuria. Does have vaginal discharge and itching, history of recurrent yeast infections, will treat with fluconazole. Cytology swab obtained. Staff to contact patient if treatment modification is indicated. Plan of care,  follow-up care and return precautions given, no questions at this time.  No problems updated.  ALLERGIES: No Known Allergies  PAST MEDICAL HISTORY: Past Medical History:  Diagnosis Date   Breast feeding status of mother    History of sickle cell trait 08/25/2011    MEDICATIONS AT HOME: Prior to Admission medications   Medication Sig Start Date End Date Taking? Authorizing Provider  naproxen (NAPROSYN) 500 MG tablet Take 1 tablet (500 mg total) by mouth 2 (two) times daily with a meal. Prn pain 05/23/23  Yes Aaliya Maultsby M, PA-C  fluconazole (DIFLUCAN) 150 MG tablet Take one tablet today and another in 72 hours if vaginal itching persists. Patient not taking: Reported on 05/23/2023 05/08/23   G[place], [place] N, FNP    ROS: Neg HEENT Neg resp Neg cardiac Neg GI Neg GU Neg MS Neg psych Neg neuro  Objective:   Vitals:   05/23/23 1629  BP: 109/71  Pulse: 94  SpO2: 98%  Weight: 144 lb 9.6 oz (65.6 kg)   Exam General appearance : Awake, alert, not in any distress. Speech Clear. Not toxic looking HEENT: Atraumatic and Normocephalic, pupils equally reactive to light and accomodation Neck: Supple, no JVD. No cervical lymphadenopathy.  Chest: Good air entry bilaterally, CTAB.  No rales/rhonchi/wheezing CVS: S1 S2 regular, no murmurs.  Abdomen: Bowel sounds present, Non tender and not distended with no gaurding, rigidity or rebound. Minimal discomfort with palpation of general lower half of abdomen but no localized TTP Extremities: B/L  Lower Ext shows no edema, both legs are warm to touch Neurology: Awake alert, and oriented X 3, CN II-XII intact, Non focal Skin: No Rash  Data Review No results found for: "HGBA1C"  Assessment & Plan   1. Pelvic pain To ED if worsens/vomiting, etc.  They verbalized understanding - hCG, serum, qualitative - CBC with Differential/Platelet - US Pelvic Complete With Transvaginal; Future Naprosyn sent  2. Yeast infection Resolved but  will retest for infection to R/O PID - Glucose (CBG) - Cervicovaginal ancillary only - Hemoglobin A1c  3. Language barrier-spanish speaking only    Refused AMN.  Daughter interpreted     Return if symptoms worsen or fail to improve.  The patient was given clear instructions to go to ER or return to medical center if symptoms don't improve, worsen or new problems develop. The patient verbalized understanding. The patient was told to call to get lab results if they haven't heard anything in the next week.      Georgian Co, PA-C [facility] [place], [place] [phone]   05/23/2023, 4:46 PM

## 2023-05-24 ENCOUNTER — Ambulatory Visit: Payer: Self-pay | Attending: Family Medicine

## 2023-05-25 LAB — CBC WITH DIFFERENTIAL/PLATELET
Basophils Absolute: 0 10*3/uL (ref 0.0–0.2)
Basos: 1 %
EOS (ABSOLUTE): 0.3 10*3/uL (ref 0.0–0.4)
Eos: 6 %
Hematocrit: 34 % (ref 34.0–46.6)
Hemoglobin: 11.7 g/dL (ref 11.1–15.9)
Immature Grans (Abs): 0 10*3/uL (ref 0.0–0.1)
Immature Granulocytes: 0 %
Lymphocytes Absolute: 1.4 10*3/uL (ref 0.7–3.1)
Lymphs: 28 %
MCH: 30.1 pg (ref 26.6–33.0)
MCHC: 34.4 g/dL (ref 31.5–35.7)
MCV: 87 fL (ref 79–97)
Monocytes Absolute: 0.3 10*3/uL (ref 0.1–0.9)
Monocytes: 7 %
Neutrophils Absolute: 3 10*3/uL (ref 1.4–7.0)
Neutrophils: 58 %
Platelets: 255 10*3/uL (ref 150–450)
RBC: 3.89 x10E6/uL (ref 3.77–5.28)
RDW: 13.4 % (ref 11.7–15.4)
WBC: 5 10*3/uL (ref 3.4–10.8)

## 2023-05-25 LAB — HCG, SERUM, QUALITATIVE: hCG,Beta Subunit,Qual,Serum: NEGATIVE m[IU]/mL (ref <6)

## 2023-05-25 LAB — HEMOGLOBIN A1C
Est. average glucose Bld gHb Est-mCnc: 105 mg/dL
Hgb A1c MFr Bld: 5.3 % (ref 4.8–5.6)

## 2023-05-27 LAB — CERVICOVAGINAL ANCILLARY ONLY
Bacterial Vaginitis (gardnerella): NEGATIVE
Candida Glabrata: NEGATIVE
Candida Vaginitis: NEGATIVE
Chlamydia: NEGATIVE
Comment: NEGATIVE
Comment: NEGATIVE
Comment: NEGATIVE
Comment: NEGATIVE
Comment: NEGATIVE
Comment: NORMAL
Neisseria Gonorrhea: NEGATIVE
Trichomonas: NEGATIVE

## 2023-05-29 ENCOUNTER — Telehealth: Payer: Self-pay

## 2023-05-29 NOTE — Telephone Encounter (Signed)
-----   Message from Georgian Co sent at 05/27/2023 11:11 AM EDT ----- Blood count is normal/no anemia.  Blood pregnancy test negative. No diabetes.  Thanks, Georgian Co, PA-C

## 2023-05-29 NOTE — Telephone Encounter (Signed)
Pt was called and is aware of results, DOB was confirmed.  Interpreter id # [phone]

## 2023-05-29 NOTE — Telephone Encounter (Signed)
-----   Message from Georgian Co sent at 05/28/2023  5:11 PM EDT ----- Please call patient.  Vaginal swabs were negative for yeast, BV, and STD including gonorrhea, trichomonas, and chlamydia.  Thanks, Georgian Co, PA-C

## 2023-06-03 ENCOUNTER — Other Ambulatory Visit: Payer: Self-pay

## 2023-06-04 ENCOUNTER — Ambulatory Visit (HOSPITAL_COMMUNITY): Payer: Self-pay

## 2023-08-28 ENCOUNTER — Encounter (HOSPITAL_COMMUNITY): Payer: Self-pay

## 2023-08-28 ENCOUNTER — Ambulatory Visit (HOSPITAL_COMMUNITY)
Admission: EM | Admit: 2023-08-28 | Discharge: 2023-08-28 | Disposition: A | Discharge status: Home / Self Care | Payer: Self-pay | Attending: Physician Assistant | Admitting: Physician Assistant

## 2023-08-28 DIAGNOSIS — H9201 Otalgia, right ear: Secondary | ICD-10-CM

## 2023-08-28 DIAGNOSIS — J302 Other seasonal allergic rhinitis: Secondary | ICD-10-CM

## 2023-08-28 DIAGNOSIS — H6991 Unspecified Eustachian tube disorder, right ear: Secondary | ICD-10-CM

## 2023-08-28 MED ORDER — CETIRIZINE HCL 10 MG PO TABS
10.0000 mg | ORAL_TABLET | Freq: Every day | ORAL | 1 refills | Status: AC
  Filled 2023-08-28: qty 30, 30d supply, fill #0

## 2023-08-28 MED ORDER — FLUTICASONE PROPIONATE 50 MCG/ACT NA SUSP
1.0000 | Freq: Every day | NASAL | 0 refills | Status: AC
Start: 2023-08-28 — End: ?
  Filled 2023-08-28: qty 16, 30d supply, fill #0

## 2023-08-28 NOTE — ED Triage Notes (Signed)
Patient here today with c/o right ear pain X 2 weeks. She has been taking Tylenol with some relief.   Patient would also like to discuss her seasonal allergies.

## 2023-08-28 NOTE — Discharge Instructions (Signed)
I do not see evidence of an infection.  I suspect her seasonal allergies are contributing to symptoms.  Start cetirizine daily.  Use fluticasone nasal spray to help with congestion symptoms.  I also recommend nasal saline and sinus rinses.  You can alternate Tylenol and ibuprofen for discomfort.  If symptoms are not improving quickly or if anything worsens you have increasing pain, fever, drainage from the ear, difficulty hearing you should be seen immediately.  No veo evidencia de una infeccin.  Sospecho que sus Theatre manager contribuyen a los sntomas.  Comience con cetirizina diariamente.  Utilice un aerosol nasal de fluticasona para ayudar con los sntomas de [place].  Tambin recomiendo enjuagues nasales con solucin salina y sinusales.  Puedes alternar Tylenol e ibuprofeno para las molestias.  Si los sntomas no mejoran rpidamente o si algo empeora, tiene dolor creciente, fiebre, secrecin del odo o dificultad para or, debe ser atendido de inmediato.

## 2023-08-28 NOTE — ED Provider Notes (Signed)
[facility]    CSN: [id number] Arrival date & time: 08/28/23  1730      History   Chief Complaint Chief Complaint  Patient presents with   Otalgia    HPI Beth Russell is a 37 y.o. female.   Patient presents today companied by her daughter who provided Spanish translation after declining a video interpreter.  Reports a 2-week history of intermittent right ear pain.  Reports pain is rated 7 on a 0-10 pain scale, described as pressure, no aggravating or alleviating factors identified.  She has tried Tylenol without improvement of symptoms.  She denies any recent illness but does have daily allergies including congestion, sinus pressure, occasional cough.  She is not currently prescribed any medication for this.  She denies any recent antibiotics.  She denies any recent airplane travel, diving, swimming.  She does not use anything in her ears including earbuds, earplugs, Q-tips.  She has no concern for pregnancy.    Past Medical History:  Diagnosis Date   Breast feeding status of mother    History of sickle cell trait 08/25/2011    Patient Active Problem List   Diagnosis Date Noted   Labor and delivery, indication for care 01/17/2020   [redacted] weeks gestation of pregnancy 01/17/2020   UTI (urinary tract infection) in pregnancy in third trimester 07/31/2014   Supervision of normal pregnancy 08/25/2011   History of sickle cell trait 08/25/2011   Language barrier-spanish speaking only  08/25/2011    Past Surgical History:  Procedure Laterality Date   DILATION AND CURETTAGE OF UTERUS N/A 10/06/2014   Procedure: SUCTION DILATATION AND CURETTAGE / BAKRI BALLOON PLACEMENT / REPAIR OF VAGINAL LACERATION;  Surgeon: Lesly Dukes, MD;  Location: [facility];  Service: Gynecology;  Laterality: N/A;    OB History     Gravida  5   Para  4   Term  4   Preterm  0   AB  0   Living  4      SAB  0   IAB  0   Ectopic  0   Multiple  0   Live Births  4             Home Medications    Prior to Admission medications   Medication Sig Start Date End Date Taking? Authorizing Provider  cetirizine (ZYRTEC ALLERGY) 10 MG tablet Take 1 tablet (10 mg total) by mouth at bedtime. 08/28/23  Yes Hitoshi Werts K, PA-C  fluticasone (FLONASE) 50 MCG/ACT nasal spray Place 1 spray into both nostrils daily. 08/28/23  Yes Zeenat Jeanbaptiste, Noberto Retort, PA-C    Family History Family History  Problem Relation Age of Onset   Cancer Sister        cervical   Cancer Maternal Uncle        colon    Social History Social History   Tobacco Use   Smoking status: Never   Smokeless tobacco: Never  Vaping Use   Vaping status: Never Used  Substance Use Topics   Alcohol use: No   Drug use: No     Allergies   Patient has no known allergies.   Review of Systems Review of Systems  Constitutional:  Positive for activity change. Negative for appetite change, fatigue and fever.  HENT:  Positive for congestion, ear pain, postnasal drip, sinus pressure and sneezing. Negative for sore throat.   Respiratory:  Negative for cough and shortness of breath.   Cardiovascular:  Negative for chest  pain.  Gastrointestinal:  Negative for abdominal pain, diarrhea, nausea and vomiting.  Neurological:  Negative for dizziness and headaches.     Physical Exam Triage Vital Signs ED Triage Vitals  Encounter Vitals Group     BP 08/28/23 1822 106/67     Systolic BP Percentile --      Diastolic BP Percentile --      Pulse Rate 08/28/23 1822 83     Resp 08/28/23 1822 16     Temp 08/28/23 1822 98 F (36.7 C)     Temp Source 08/28/23 1822 Oral     SpO2 08/28/23 1822 97 %     Weight --      Height 08/28/23 1821 5\' 3"  (1.6 m)     Head Circumference --      Peak Flow --      Pain Score 08/28/23 1821 7     Pain Loc --      Pain Education --      Exclude from Growth Chart --    No data found.  Updated Vital Signs BP 106/67 (BP Location: Left Arm)   Pulse 83   Temp 98 F (36.7 C)  (Oral)   Resp 16   Ht 5\' 3"  (1.6 m)   LMP 08/05/2023 (Approximate)   SpO2 97%   BMI 25.61 kg/m   Visual Acuity Right Eye Distance:   Left Eye Distance:   Bilateral Distance:    Right Eye Near:   Left Eye Near:    Bilateral Near:     Physical Exam Vitals reviewed.  Constitutional:      General: She is awake. She is not in acute distress.    Appearance: Normal appearance. She is well-developed. She is not ill-appearing.     Comments: Very pleasant female appears stated age in no acute distress sitting comfortably in exam room  HENT:     Head: Normocephalic and atraumatic.     Right Ear: Ear canal and external ear normal. A middle ear effusion is present. Tympanic membrane is not erythematous or bulging.     Left Ear: Tympanic membrane, ear canal and external ear normal. Tympanic membrane is not erythematous or bulging.     Nose:     Right Sinus: No maxillary sinus tenderness or frontal sinus tenderness.     Left Sinus: No maxillary sinus tenderness or frontal sinus tenderness.     Mouth/Throat:     Pharynx: Uvula midline. Postnasal drip present. No oropharyngeal exudate or posterior oropharyngeal erythema.  Cardiovascular:     Rate and Rhythm: Normal rate and regular rhythm.     Heart sounds: Normal heart sounds, S1 normal and S2 normal. No murmur heard. Pulmonary:     Effort: Pulmonary effort is normal.     Breath sounds: Normal breath sounds. No wheezing, rhonchi or rales.     Comments: Clear to auscultation bilaterally Psychiatric:        Behavior: Behavior is cooperative.      UC Treatments / Results  Labs (all labs ordered are listed, but only abnormal results are displayed) Labs Reviewed - No data to display  EKG   Radiology No results found.  Procedures Procedures (including critical care time)  Medications Ordered in UC Medications - No data to display  Initial Impression / Assessment and Plan / UC Course  I have reviewed the triage vital signs and  the nursing notes.  Pertinent labs & imaging results that were available during my care of the patient  were reviewed by me and considered in my medical decision making (see chart for details).     Patient is well-appearing, afebrile, nontoxic, nontachycardic.  No evidence of acute infection on physical exam though would warrant initiation of antibiotics.  No cerumen impaction on exam.  She does have air-fluid level concerning for effusion and we discussed that eustachian tube dysfunction could be contributing to her symptoms likely triggered by her seasonal allergies.  Will start fluticasone nasal spray and daily cetirizine to manage her symptoms.  Also recommended nasal saline and sinus rinses.  She is to follow-up with her primary care.  Discussed that if anything worsens or changes and she has increasing pain, fever, otorrhea, nausea, vomiting she needs to be seen immediately.  Strict return precautions given.  All questions answered to patient and daughter satisfaction.  Final Clinical Impressions(s) / UC Diagnoses   Final diagnoses:  Seasonal allergies  Eustachian tube dysfunction, right  Acute otalgia, right     Discharge Instructions      I do not see evidence of an infection.  I suspect her seasonal allergies are contributing to symptoms.  Start cetirizine daily.  Use fluticasone nasal spray to help with congestion symptoms.  I also recommend nasal saline and sinus rinses.  You can alternate Tylenol and ibuprofen for discomfort.  If symptoms are not improving quickly or if anything worsens you have increasing pain, fever, drainage from the ear, difficulty hearing you should be seen immediately.  No veo evidencia de una infeccin.  Sospecho que sus Theatre manager contribuyen a los sntomas.  Comience con cetirizina diariamente.  Utilice un aerosol nasal de fluticasona para ayudar con los sntomas de [place].  Tambin recomiendo enjuagues nasales con solucin salina y sinusales.   Puedes alternar Tylenol e ibuprofeno para las molestias.  Si los sntomas no mejoran rpidamente o si algo empeora, tiene dolor creciente, fiebre, secrecin del odo o dificultad para or, debe ser atendido de inmediato.     ED Prescriptions     Medication Sig Dispense Auth. Provider   fluticasone (FLONASE) 50 MCG/ACT nasal spray Place 1 spray into both nostrils daily. 16 g Watson Robarge K, PA-C   cetirizine (ZYRTEC ALLERGY) 10 MG tablet Take 1 tablet (10 mg total) by mouth at bedtime. 30 tablet Syndi Pua, Noberto Retort, PA-C      PDMP not reviewed this encounter.   Jeani Hawking, PA-C 08/28/23 [id number]

## 2023-08-29 ENCOUNTER — Other Ambulatory Visit: Payer: Self-pay

## 2023-09-10 ENCOUNTER — Other Ambulatory Visit: Payer: Self-pay

## 2024-02-07 ENCOUNTER — Ambulatory Visit: Payer: Self-pay

## 2024-02-07 NOTE — Telephone Encounter (Signed)
 Noted.

## 2024-02-07 NOTE — Telephone Encounter (Signed)
  Chief Complaint: right breast/right upper back pain Symptoms: fever 101 (a week ago), right breast painful, red and itchy, right upper back pain Frequency: x 3 months, worsened since yesterday Pertinent Negatives: Patient denies right breast discharge or heat Disposition: [] ED /[x] Urgent Care (no appt availability in office) / [] Appointment(In office/virtual)/ []  [facility]/ [] Home Care/ [] Refused Recommended Disposition /[] [facility]/ []  Follow-up with PCP Additional Notes: Spanish interpreter Dot Gazella ID # [id number] on phone with patient for translation. Patient states she has been taking Tylenol  and it does calm the pain but it returns. No available appts with PCP, patient agreeable to go to urgent care.  Copied from CRM [id number]. Topic: Clinical - Red Word Triage >> Feb 07, 2024  8:23 AM Baldemar Lev wrote: Red Word that prompted transfer to Nurse Triage: Chest pain, pain in breasts Reason for Disposition  [1] Breast looks infected (spreading redness, feels hot or painful to touch) AND [2] no fever  Answer Assessment - Initial Assessment Questions 1. LOCATION: "Where does it hurt?"       Right breast. (Patient states she is not sure if the pain is in her breast anymore or her back she states it has kinda morphed into the same thing)  2. RADIATION: "Does the pain go anywhere else?" (e.g., into neck, jaw, arms, back)     Wraps around to right side of back.  3. ONSET: "When did the chest pain begin?" (Minutes, hours or days)      3 months ago, worsened over the past day.  4. PATTERN: "Does the pain come and go, or has it been constant since it started?"  "Does it get worse with exertion?"      Comes and goes, but has been constant since yesterday.  5. DURATION: "How long does it last" (e.g., seconds, minutes, hours)     2 hours.  6. SEVERITY: "How bad is the pain?"  (e.g., Scale 1-10; mild, moderate, or severe)    - MILD (1-3): doesn't interfere with normal  activities     - MODERATE (4-7): interferes with normal activities or awakens from sleep    - SEVERE (8-10): excruciating pain, unable to do any normal activities       Yes, patient states it is right upper back pain at this time. 8/10.  7. CARDIAC RISK FACTORS: "Do you have any history of heart problems or risk factors for heart disease?" (e.g., angina, prior heart attack; diabetes, high blood pressure, high cholesterol, smoker, or strong family history of heart disease)     Denies.  8. PULMONARY RISK FACTORS: "Do you have any history of lung disease?"  (e.g., blood clots in lung, asthma, emphysema, birth control pills)     Denies.  9. CAUSE: "What do you think is causing the chest pain?"     Unsure; patient states when she was breastfeeding her last baby he would not latch onto her right breast. She states she has had discharge from that breast but it was 2 years ago.  10. OTHER SYMPTOMS: "Do you have any other symptoms?" (e.g., dizziness, nausea, vomiting, sweating, fever, difficulty breathing, cough)       Right breast/nipple redness and itchy, fever about a week ago.  11. PREGNANCY: "Is there any chance you are pregnant?" "When was your last menstrual period?"       LMP: 01/17/24.  Protocols used: Chest Pain-A-AH, Breast Symptoms-A-AH

## 2024-05-24 ENCOUNTER — Encounter (HOSPITAL_COMMUNITY): Payer: Self-pay

## 2024-05-24 ENCOUNTER — Other Ambulatory Visit: Payer: Self-pay

## 2024-05-24 ENCOUNTER — Emergency Department (HOSPITAL_COMMUNITY)
Admission: EM | Admit: 2024-05-24 | Discharge: 2024-05-24 | Disposition: A | Discharge status: Home / Self Care | Payer: Self-pay | Attending: Emergency Medicine | Admitting: Emergency Medicine

## 2024-05-24 DIAGNOSIS — H6121 Impacted cerumen, right ear: Secondary | ICD-10-CM | POA: Insufficient documentation

## 2024-05-24 DIAGNOSIS — F410 Panic disorder [episodic paroxysmal anxiety] without agoraphobia: Secondary | ICD-10-CM

## 2024-05-24 DIAGNOSIS — E876 Hypokalemia: Secondary | ICD-10-CM | POA: Insufficient documentation

## 2024-05-24 DIAGNOSIS — F419 Anxiety disorder, unspecified: Secondary | ICD-10-CM | POA: Insufficient documentation

## 2024-05-24 LAB — COMPREHENSIVE METABOLIC PANEL WITH GFR
ALT: 15 U/L (ref 0–44)
AST: 19 U/L (ref 15–41)
Albumin: 3.8 g/dL (ref 3.5–5.0)
Alkaline Phosphatase: 58 U/L (ref 38–126)
Anion gap: 9 (ref 5–15)
BUN: 10 mg/dL (ref 6–20)
CO2: 23 mmol/L (ref 22–32)
Calcium: 8.7 mg/dL — ABNORMAL LOW (ref 8.9–10.3)
Chloride: 105 mmol/L (ref 98–111)
Creatinine, Ser: 0.75 mg/dL (ref 0.44–1.00)
GFR, Estimated: >60 mL/min (ref >60)
Glucose, Bld: 112 mg/dL — ABNORMAL HIGH (ref 70–99)
Potassium: 3.3 mmol/L — ABNORMAL LOW (ref 3.5–5.1)
Sodium: 137 mmol/L (ref 135–145)
Total Bilirubin: 0.3 mg/dL (ref 0.0–1.2)
Total Protein: 7.1 g/dL (ref 6.5–8.1)

## 2024-05-24 LAB — CBC WITH DIFFERENTIAL/PLATELET
Abs Immature Granulocytes: 0.01 K/uL (ref 0.00–0.07)
Basophils Absolute: 0 K/uL (ref 0.0–0.1)
Basophils Relative: 1 %
Eosinophils Absolute: 0.1 K/uL (ref 0.0–0.5)
Eosinophils Relative: 2 %
HCT: 35 % — ABNORMAL LOW (ref 36.0–46.0)
Hemoglobin: 11.7 g/dL — ABNORMAL LOW (ref 12.0–15.0)
Immature Granulocytes: 0 %
Lymphocytes Relative: 23 %
Lymphs Abs: 1.2 K/uL (ref 0.7–4.0)
MCH: 29.8 pg (ref 26.0–34.0)
MCHC: 33.4 g/dL (ref 30.0–36.0)
MCV: 89.3 fL (ref 80.0–100.0)
Monocytes Absolute: 0.4 K/uL (ref 0.1–1.0)
Monocytes Relative: 8 %
Neutro Abs: 3.3 K/uL (ref 1.7–7.7)
Neutrophils Relative %: 66 %
Platelets: 234 K/uL (ref 150–400)
RBC: 3.92 MIL/uL (ref 3.87–5.11)
RDW: 12.2 % (ref 11.5–15.5)
WBC: 5 K/uL (ref 4.0–10.5)
nRBC: 0 % (ref 0.0–0.2)

## 2024-05-24 LAB — HCG, SERUM, QUALITATIVE: Preg, Serum: NEGATIVE

## 2024-05-24 NOTE — ED Triage Notes (Addendum)
 BIB EMS for anxiety and hyperventilating.  Reports patient was sitting in her car outside of [employer] with her family and started hyperventilating and saying she couldn't breathe and thought she was dying.  EMS reports visible anxiety attack was placed on 2L [place] and breathing got better and patient calmed down. Patient reports her throat just feels really dry and she had difficulty swallowing which led her to panic. REports at work they started working with Public librarian and she thinks she is having a reaction to that.

## 2024-05-24 NOTE — Discharge Instructions (Signed)
 Follow-up with your primary care provider if your symptoms persist.  Return to the emergency department if your symptoms worsen.

## 2024-05-24 NOTE — ED Provider Notes (Signed)
 [place] EMERGENCY DEPARTMENT AT [facility] Provider Note   CSN: [id number] Arrival date & time: 05/24/24  1603     Patient presents with: Anxiety   Beth Russell is a 38 y.o. female.   38 year old female presenting with multiple complaints.  Patient was in her car in the [employer] parking lot waiting on her husband to come out of the store when she tells me that all the saliva left my mouth, this then caused her to feel like my heart was going to fail and she says it was beating rapidly, this caused her to have difficulty breathing and she was placed on 2 L [place] by EMS.  Currently she is back to her baseline and not on oxygen, she denies chest pain or shortness of breath that does endorse burning in her ears bilaterally.  She does not take any medications daily including contraceptives, no lower extremity edema, no history of blood clots.  She denies any new/worsening stressors.     Anxiety       Prior to Admission medications   Medication Sig Start Date End Date Taking? Authorizing Provider  cetirizine  (ZYRTEC  ALLERGY) 10 MG tablet Take 1 tablet (10 mg total) by mouth at bedtime. 08/28/23   Raspet, Marrian Bells K, PA-C  fluticasone  (FLONASE ) 50 MCG/ACT nasal spray Place 1 spray into both nostrils daily. 08/28/23   Raspet, Filbert Craze K, PA-C    Allergies: Patient has no known allergies.    Review of Systems  Updated Vital Signs  Vitals:   05/24/24 1610 05/24/24 1611 05/24/24 1615  BP: 122/80    Pulse: 70    Resp: 16    Temp: 97.9 F (36.6 C)    TempSrc: Oral    SpO2: 100% 99%   Weight:   65.3 kg  Height:   5' 3 (1.6 m)     Physical Exam Vitals and nursing note reviewed.  HENT:     Head: Normocephalic.     Right Ear: Tympanic membrane normal. Tympanic membrane is not perforated, erythematous or bulging.     Left Ear: Tympanic membrane normal. Tympanic membrane is not perforated, erythematous or bulging.     Ears:     Comments: R TM partially  obscured by cerumen    Mouth/Throat:     Mouth: Mucous membranes are moist.     Pharynx: No pharyngeal swelling, oropharyngeal exudate, posterior oropharyngeal erythema or uvula swelling.     Tonsils: No tonsillar exudate or tonsillar abscesses.  Eyes:     Extraocular Movements: Extraocular movements intact.  Cardiovascular:     Rate and Rhythm: Normal rate and regular rhythm.     Heart sounds: Normal heart sounds.  Pulmonary:     Effort: Pulmonary effort is normal.     Breath sounds: Normal breath sounds.  Musculoskeletal:     Cervical back: Normal range of motion.     Right lower leg: No edema.     Left lower leg: No edema.     Comments: Moves all extremities spontaneously without difficulty  Skin:    General: Skin is warm and dry.  Neurological:     Mental Status: She is alert and oriented to person, place, and time.  Psychiatric:        Mood and Affect: Mood normal.        Behavior: Behavior normal.     (all labs ordered are listed, but only abnormal results are displayed) Labs Reviewed  CBC WITH DIFFERENTIAL/PLATELET - Abnormal; Notable for  the following components:      Result Value   Hemoglobin 11.7 (*)    HCT 35.0 (*)    All other components within normal limits  COMPREHENSIVE METABOLIC PANEL WITH GFR - Abnormal; Notable for the following components:   Potassium 3.3 (*)    Glucose, Bld 112 (*)    Calcium  8.7 (*)    All other components within normal limits  HCG, SERUM, QUALITATIVE    EKG: None  Radiology: No results found.   Procedures   Medications Ordered in the ED - No data to display                                  Medical Decision Making This patient presents to the ED for concern of anxiety, this involves an extensive number of treatment options, and is a complaint that carries with it a high risk of complications and morbidity.  The differential diagnosis includes anxiety, depression, panic attack, arrhythmia, pulmonary embolism.    Co  morbidities that complicate the patient evaluation  Seasonal allergies   Additional history obtained:  Additional history obtained from record review External records from outside source obtained and reviewed including previous urgent care note   Lab Tests:  I Ordered, and personally interpreted labs.  The pertinent results include: CBC stable as compared to previous. CMP mild hypokalemia with potassium of 3.3, otherwise stable from previous. Serum hcg negative.   Cardiac Monitoring: / EKG:  The patient was maintained on a cardiac monitor.  I personally viewed and interpreted the cardiac monitored which showed an underlying rhythm of: NSR   Problem List / ED Course / Critical interventions / Medication management I have reviewed the patients home medicines and have made adjustments as needed   Social Determinants of Health:  Depression   Test / Admission - Considered:  Physical exam is largely unremarkable as above, patient is in no acute distress, her mood/behavior is normal.  PERC negative.  Patient reports that she feels back to her baseline, I suspect that her symptoms today were likely secondary to a panic attack but did obtain basic labs and EKG which were reassuring as above.  I discussed these findings in depth with the patient, advised her to follow-up with her primary care provider if the symptoms persist.  Return precautions discussed.  She is in agreement with this plan is appropriate for discharge at this time. Spanish interpreter services were used throughout the duration of this encounter.  Amount and/or Complexity of Data Reviewed Labs: ordered.        Final diagnoses:  Anxiety attack    ED Discharge Orders     None          Glendia Rocky SAILOR, [place] 05/24/24 1807    Franklyn Sid SAILOR, MD 05/24/24 [phone]
